# Patient Record
Sex: Female | Born: 1980 | Race: White | Hispanic: No | Marital: Single | Smoking: Never smoker
Health system: Southern US, Community
[De-identification: ages and names within clinical notes are randomized; demographics above are authoritative.]

## PROBLEM LIST (undated history)

## (undated) DIAGNOSIS — R87629 Unspecified abnormal cytological findings in specimens from vagina: Secondary | ICD-10-CM

## (undated) HISTORY — DX: Unspecified abnormal cytological findings in specimens from vagina: R87.629

---

## 2006-03-12 ENCOUNTER — Ambulatory Visit (HOSPITAL_COMMUNITY): Admission: RE | Admit: 2006-03-12 | Discharge: 2006-03-12 | Payer: Self-pay | Admitting: Obstetrics & Gynecology

## 2006-05-12 DIAGNOSIS — O1490 Unspecified pre-eclampsia, unspecified trimester: Secondary | ICD-10-CM

## 2006-06-05 ENCOUNTER — Inpatient Hospital Stay (HOSPITAL_COMMUNITY): Admission: AD | Admit: 2006-06-05 | Discharge: 2006-06-07 | Payer: Self-pay | Admitting: Obstetrics and Gynecology

## 2006-12-17 ENCOUNTER — Emergency Department: Payer: Self-pay | Admitting: Emergency Medicine

## 2007-10-26 ENCOUNTER — Inpatient Hospital Stay (HOSPITAL_COMMUNITY): Admission: RE | Admit: 2007-10-26 | Discharge: 2007-10-28 | Payer: Self-pay | Admitting: Obstetrics & Gynecology

## 2008-09-09 IMAGING — CR RIGHT ANKLE - COMPLETE 3+ VIEW
1 series · 4 of 4 positions shown · non-contrast
Comparison: none

REASON FOR EXAM: FALL
COMMENTS:

PROCEDURE:     DXR - DXR ANKLE RIGHT COMPLETE  - December 17, 2006 [DATE]
RESULT:     No fracture, dislocation or other acute bony abnormality is
identified. The ankle mortise is well maintained.

[Series 1: view not recorded · 0.17mm/px · 4 of 4 slices shown]
[im 1/4]
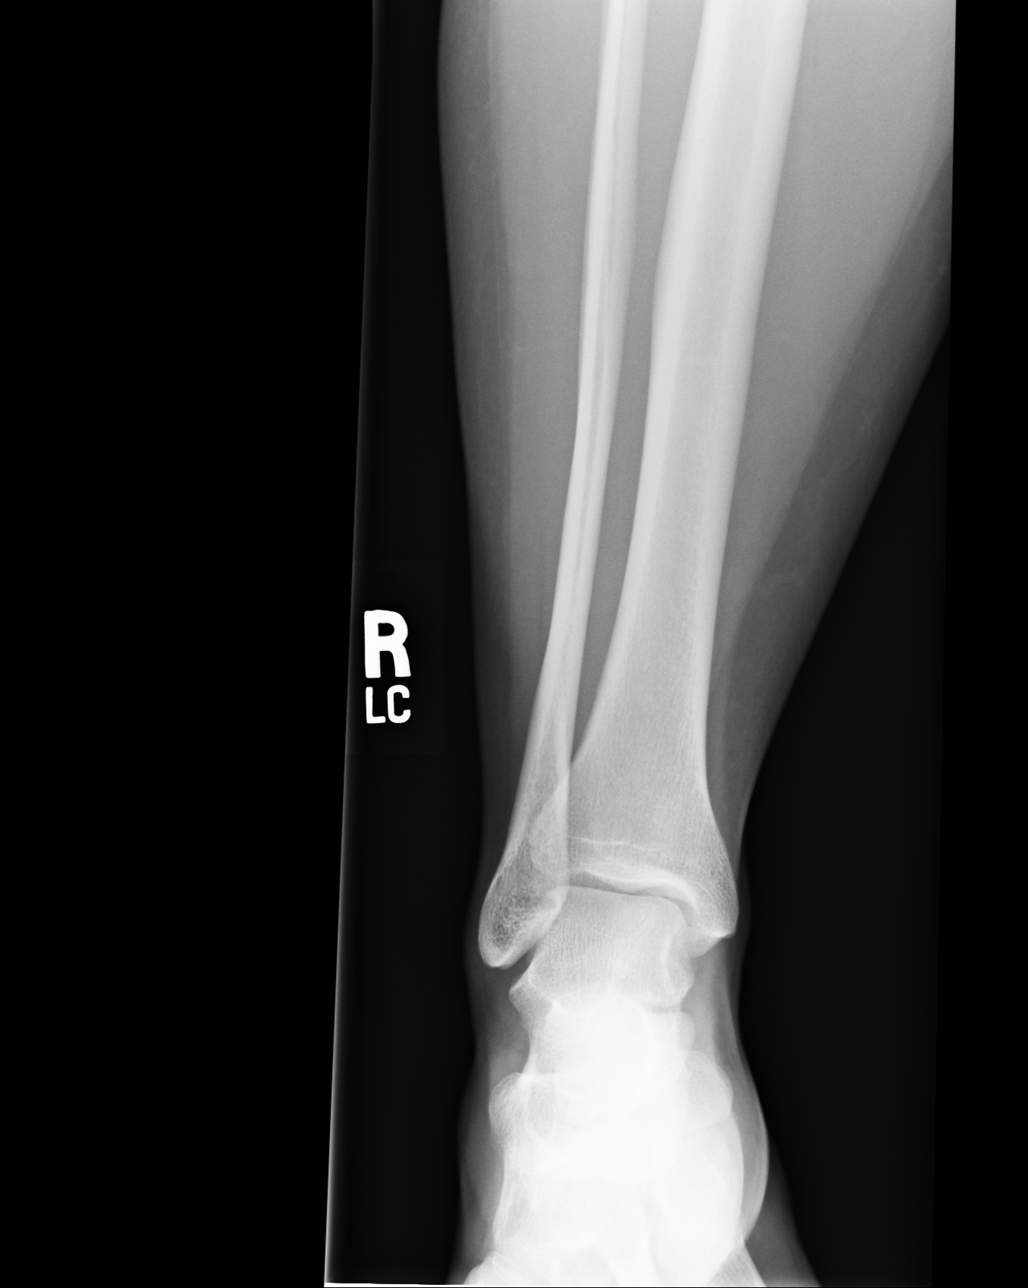
[im 2/4]
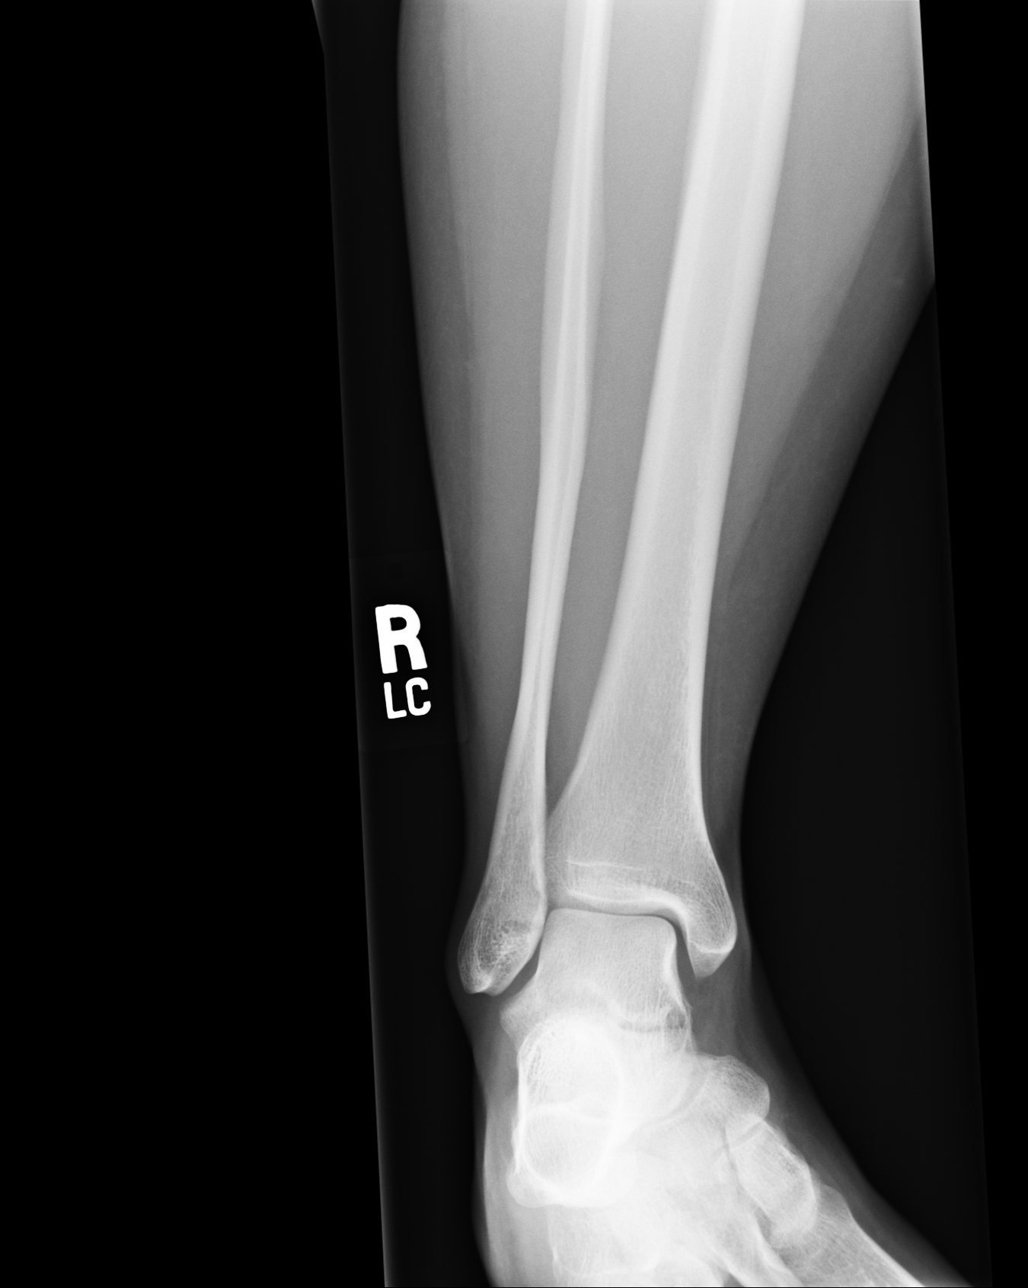
[im 3/4]
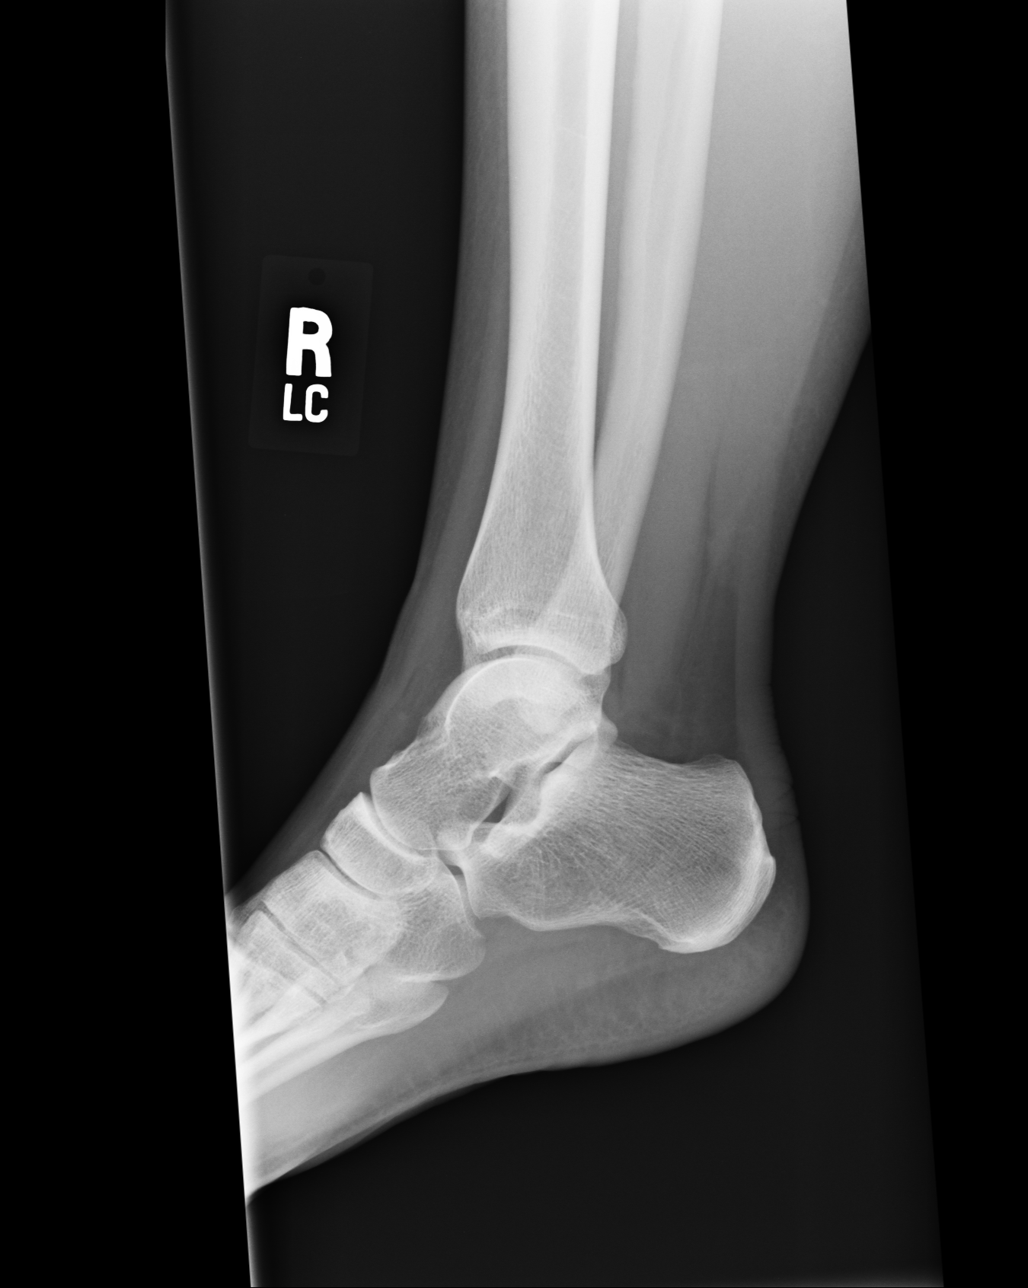
[im 4/4]
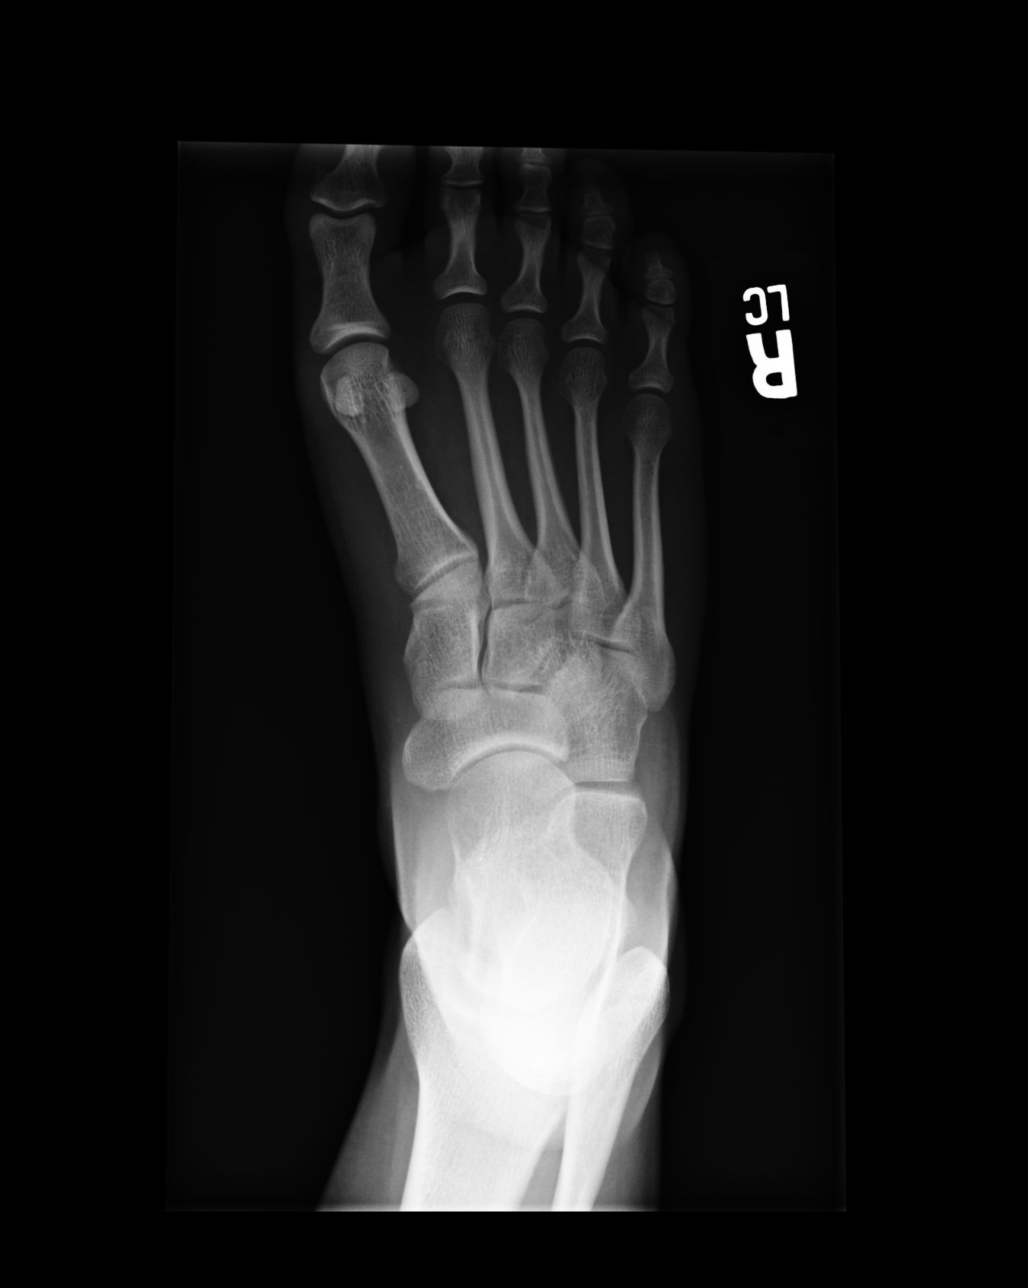

[4 of 4 positions shown; findings below may reference images not displayed]

IMPRESSION: 1.     No significant osseous abnormalities are noted.

## 2010-09-27 NOTE — H&P (Signed)
NAME:  Brenda Leonard, MACIVER            ACCOUNT NO.:  [id number]   MEDICAL RECORD NO.:  [id number]          PATIENT TYPE:  INP   LOCATION:                                FACILITY:  [facility]   PHYSICIAN:  Guy Sandifer. Henderson Cloud, M.D. DATE OF BIRTH:  1980-09-22   DATE OF ADMISSION:  06/05/2006  DATE OF DISCHARGE:  06/07/2006                              HISTORY & PHYSICAL   CHIEF COMPLAINT:  Low amniotic fluid.   HISTORY OF PRESENT ILLNESS:  This patient is a 30 year old G1, P0 with  an EDC of June 17, 2006, placing her at 38-2/7 weeks.  Evaluation in  the office today reveals vertex baby with an estimated fetal weight of 6  pounds 10 ounces at the 38th percentile.  Amniotic fluid index is 7.5  which is the 5th percentile.  Biophysical profile is 8/8 and nonstress  test was reactive.  Also noted in the office was a blood pressure of  118/74 and 1+ urine protein.  After discussion with patient, she is  being admitted to the hospital for induction of labor with low amniotic  fluid.   MEDICATIONS:  Prenatal vitamins.   ALLERGIES:  NO KNOWN DRUG ALLERGIES.   PAST MEDICAL HISTORY:  See prenatal history and physical.   PAST SURGICAL HISTORY:  See prenatal history and physical.   OBSTETRICAL HISTORY:  See prenatal history and physical.   SOCIAL HISTORY:  Denies tobacco, alcohol or drug abuse.   REVIEW OF SYSTEMS:  Denies headache, blurry vision, epigastric pain.   PHYSICAL EXAMINATION:  VITAL SIGNS:  Height 5 feet 3 inches, weight 189  pounds, blood pressure 118/74.  LUNGS:  Clear to auscultation.  CARDIOVASCULAR:  Regular rate and rhythm without rubs.  ABDOMEN:  Gravid with no epigastric tenderness.  PELVIC:  Cervix is 3 cm dilated, 80% effaced, -2 station and vertex.  EXTREMITIES:  There is 1+ edema.  Deep tendon reflexes 2+ without  clonus.   LABORATORY DATA:  Group B Beta strep culture is negative.   ASSESSMENT:  Intrauterine pregnancy at 38-1/2 weeks with low amniotic  fluid.   PLAN:  Would admit for two stage induction of labor.  Will check [facility]  labs and repeat clean catch urine.      Guy Sandifer Henderson Cloud, M.D.  Electronically Signed     JET/MEDQ  D:  06/05/2006  T:  06/05/2006  Job:  [id number]

## 2011-02-06 LAB — CBC
Hemoglobin: 10.6 — ABNORMAL LOW
MCHC: 33.4
MCV: 82.6
Platelets: 146 — ABNORMAL LOW
Platelets: 201
RDW: 15.2
WBC: 9.4

## 2011-02-06 LAB — RH IMMUNE GLOB WKUP(>/=20WKS)(NOT WOMEN'S HOSP): Fetal Screen: NEGATIVE

## 2011-05-13 DIAGNOSIS — R87629 Unspecified abnormal cytological findings in specimens from vagina: Secondary | ICD-10-CM

## 2011-05-13 HISTORY — DX: Unspecified abnormal cytological findings in specimens from vagina: R87.629

## 2011-12-29 ENCOUNTER — Ambulatory Visit: Payer: Self-pay | Admitting: Internal Medicine

## 2011-12-29 DIAGNOSIS — Z0289 Encounter for other administrative examinations: Secondary | ICD-10-CM

## 2013-07-06 ENCOUNTER — Emergency Department: Payer: Self-pay | Admitting: Emergency Medicine

## 2013-07-06 LAB — URINALYSIS, COMPLETE
BLOOD: NEGATIVE
Bilirubin,UR: NEGATIVE
GLUCOSE, UR: NEGATIVE mg/dL (ref 0–75)
Ketone: NEGATIVE
Leukocyte Esterase: NEGATIVE
NITRITE: NEGATIVE
PH: 5 (ref 4.5–8.0)
PROTEIN: NEGATIVE
SPECIFIC GRAVITY: 1.011 (ref 1.003–1.030)
Squamous Epithelial: <1

## 2013-07-06 LAB — CBC WITH DIFFERENTIAL/PLATELET
BASOS PCT: 0.9 %
Basophil #: 0.1 10*3/uL (ref 0.0–0.1)
EOS ABS: 0.1 10*3/uL (ref 0.0–0.7)
Eosinophil %: 1.3 %
HCT: 40.3 % (ref 35.0–47.0)
HGB: 13.8 g/dL (ref 12.0–16.0)
LYMPHS PCT: 32.5 %
Lymphocyte #: 2.1 10*3/uL (ref 1.0–3.6)
MCH: 30.6 pg (ref 26.0–34.0)
MCHC: 34.1 g/dL (ref 32.0–36.0)
MCV: 90 fL (ref 80–100)
MONO ABS: 0.5 x10 3/mm (ref 0.2–0.9)
Monocyte %: 8.2 %
NEUTROS ABS: 3.6 10*3/uL (ref 1.4–6.5)
Neutrophil %: 57.1 %
Platelet: 170 10*3/uL (ref 150–440)
RBC: 4.49 10*6/uL (ref 3.80–5.20)
RDW: 13.3 % (ref 11.5–14.5)
WBC: 6.3 10*3/uL (ref 3.6–11.0)

## 2013-07-06 LAB — COMPREHENSIVE METABOLIC PANEL
ALBUMIN: 3.6 g/dL (ref 3.4–5.0)
ALK PHOS: 85 U/L
ALT: 14 U/L (ref 12–78)
ANION GAP: 7 (ref 7–16)
AST: 11 U/L — AB (ref 15–37)
BUN: 13 mg/dL (ref 7–18)
Bilirubin,Total: 0.3 mg/dL (ref 0.2–1.0)
CALCIUM: 8.7 mg/dL (ref 8.5–10.1)
Chloride: 107 mmol/L (ref 98–107)
Co2: 24 mmol/L (ref 21–32)
Creatinine: 0.99 mg/dL (ref 0.60–1.30)
Glucose: 92 mg/dL (ref 65–99)
Osmolality: 275 (ref 275–301)
Potassium: 3.9 mmol/L (ref 3.5–5.1)
SODIUM: 138 mmol/L (ref 136–145)
TOTAL PROTEIN: 7.8 g/dL (ref 6.4–8.2)

## 2013-07-06 LAB — TROPONIN I: Troponin-I: <0.02 ng/mL

## 2013-10-06 ENCOUNTER — Emergency Department: Payer: Self-pay | Admitting: Emergency Medicine

## 2013-10-06 LAB — BASIC METABOLIC PANEL
ANION GAP: 6 — AB (ref 7–16)
BUN: 16 mg/dL (ref 7–18)
CALCIUM: 9.2 mg/dL (ref 8.5–10.1)
CHLORIDE: 105 mmol/L (ref 98–107)
CREATININE: 0.97 mg/dL (ref 0.60–1.30)
Co2: 27 mmol/L (ref 21–32)
GLUCOSE: 92 mg/dL (ref 65–99)
Osmolality: 277 (ref 275–301)
POTASSIUM: 4.3 mmol/L (ref 3.5–5.1)
SODIUM: 138 mmol/L (ref 136–145)

## 2013-10-06 LAB — TROPONIN I: Troponin-I: <0.02 ng/mL

## 2013-10-06 LAB — CBC
HCT: 40.2 % (ref 35.0–47.0)
HGB: 13.6 g/dL (ref 12.0–16.0)
MCH: 31.3 pg (ref 26.0–34.0)
MCHC: 33.7 g/dL (ref 32.0–36.0)
MCV: 93 fL (ref 80–100)
Platelet: 237 10*3/uL (ref 150–440)
RBC: 4.34 10*6/uL (ref 3.80–5.20)
RDW: 13.1 % (ref 11.5–14.5)
WBC: 10.7 10*3/uL (ref 3.6–11.0)

## 2013-10-06 LAB — HEPATIC FUNCTION PANEL A (ARMC)
ALBUMIN: 3.8 g/dL (ref 3.4–5.0)
ALK PHOS: 104 U/L
BILIRUBIN DIRECT: 0.1 mg/dL (ref 0.00–0.20)
Bilirubin,Total: 0.3 mg/dL (ref 0.2–1.0)
SGOT(AST): 45 U/L — ABNORMAL HIGH (ref 15–37)
SGPT (ALT): 26 U/L (ref 12–78)
TOTAL PROTEIN: 8 g/dL (ref 6.4–8.2)

## 2013-10-06 LAB — LIPASE, BLOOD: LIPASE: 201 U/L (ref 73–393)

## 2013-11-09 HISTORY — PX: GALLBLADDER SURGERY: SHX652

## 2013-11-22 DIAGNOSIS — K8050 Calculus of bile duct without cholangitis or cholecystitis without obstruction: Secondary | ICD-10-CM | POA: Insufficient documentation

## 2014-02-13 DIAGNOSIS — F329 Major depressive disorder, single episode, unspecified: Secondary | ICD-10-CM | POA: Insufficient documentation

## 2014-02-13 DIAGNOSIS — F32A Depression, unspecified: Secondary | ICD-10-CM | POA: Insufficient documentation

## 2014-02-13 DIAGNOSIS — F419 Anxiety disorder, unspecified: Secondary | ICD-10-CM

## 2014-02-13 DIAGNOSIS — E669 Obesity, unspecified: Secondary | ICD-10-CM | POA: Insufficient documentation

## 2014-09-29 LAB — OB RESULTS CONSOLE RPR: RPR: NONREACTIVE

## 2014-09-29 LAB — OB RESULTS CONSOLE TSH: TSH: 1.38

## 2014-09-29 LAB — OB RESULTS CONSOLE ABO/RH: RH Type: NEGATIVE

## 2014-09-29 LAB — OB RESULTS CONSOLE PLATELET COUNT: Platelets: 216 10*3/uL

## 2014-09-29 LAB — OB RESULTS CONSOLE VARICELLA ZOSTER ANTIBODY, IGG: Varicella: NON-IMMUNE/NOT IMMUNE

## 2014-09-29 LAB — OB RESULTS CONSOLE HGB/HCT, BLOOD
HEMATOCRIT: 38 %
HEMOGLOBIN: 12.9 g/dL

## 2014-09-29 LAB — OB RESULTS CONSOLE HEPATITIS B SURFACE ANTIGEN: Hepatitis B Surface Ag: NEGATIVE

## 2014-09-29 LAB — OB RESULTS CONSOLE ANTIBODY SCREEN: Antibody Screen: NEGATIVE

## 2014-09-29 LAB — HEMOGLOBIN A1C: HEMOGLOBIN A1C: 5.8 % (ref 4.0–6.0)

## 2014-09-29 LAB — OB RESULTS CONSOLE RUBELLA ANTIBODY, IGM: RUBELLA: NON-IMMUNE/NOT IMMUNE

## 2014-10-11 ENCOUNTER — Telehealth: Payer: Self-pay | Admitting: Obstetrics and Gynecology

## 2014-10-11 DIAGNOSIS — O219 Vomiting of pregnancy, unspecified: Secondary | ICD-10-CM

## 2014-10-11 NOTE — Telephone Encounter (Signed)
Patient called. She stated she is very nauseous and would like a script for diclegis. [facility] [address]Thanks

## 2014-10-12 MED ORDER — DOXYLAMINE-PYRIDOXINE 10-10 MG PO TBEC: 10.0000 mg | DELAYED_RELEASE_TABLET | Freq: Four times a day (QID) | ORAL | Status: DC | PRN

## 2014-10-12 NOTE — Telephone Encounter (Signed)
RX sent in for pt for Diclegis per protocol.

## 2014-10-12 NOTE — Addendum Note (Signed)
Addended by: Frankey ShownGLANTON, Zebulon Gantt C on: 10/12/2014 03:53 PM   Modules accepted: Orders

## 2014-10-26 ENCOUNTER — Encounter: Payer: Self-pay | Admitting: Obstetrics and Gynecology

## 2014-10-26 ENCOUNTER — Ambulatory Visit (INDEPENDENT_AMBULATORY_CARE_PROVIDER_SITE_OTHER): Payer: BLUE CROSS/BLUE SHIELD | Admitting: Obstetrics and Gynecology

## 2014-10-26 VITALS — BP 130/82 | HR 73 | Ht 62.0 in | Wt 198.1 lb

## 2014-10-26 DIAGNOSIS — R87619 Unspecified abnormal cytological findings in specimens from cervix uteri: Secondary | ICD-10-CM

## 2014-10-26 DIAGNOSIS — Z36 Encounter for antenatal screening of mother: Secondary | ICD-10-CM

## 2014-10-26 DIAGNOSIS — Z87440 Personal history of urinary (tract) infections: Secondary | ICD-10-CM | POA: Insufficient documentation

## 2014-10-26 DIAGNOSIS — Z369 Encounter for antenatal screening, unspecified: Secondary | ICD-10-CM

## 2014-10-26 DIAGNOSIS — F411 Generalized anxiety disorder: Secondary | ICD-10-CM

## 2014-10-26 DIAGNOSIS — Z3401 Encounter for supervision of normal first pregnancy, first trimester: Secondary | ICD-10-CM

## 2014-10-26 DIAGNOSIS — F419 Anxiety disorder, unspecified: Secondary | ICD-10-CM | POA: Insufficient documentation

## 2014-10-26 MED ORDER — ASPIRIN EC 81 MG PO TBEC: 81.0000 mg | DELAYED_RELEASE_TABLET | Freq: Every day | ORAL | Status: DC

## 2014-10-26 NOTE — Progress Notes (Signed)
Subjective:    Brenda Leonard is being seen today for her first obstetrical visit.  This is not a planned pregnancy. Patient was taking OCPs for contraception at discovery of pregnancy (denies missing a dose). She is at [redacted]w[redacted]d gestation, Estimated Date of Delivery: 05/16/15 by 7 week sono, Patient's last menstrual period was 08/09/2014 (approximate).  Her obstetrical history is significant for obesity and pre-eclampsia (in a previous pregnancy). Relationship with FOB: spouse, living together. Patient does intend to breast feed.  Desires partner vasectomy for contraceptive method. Pregnancy history fully reviewed.  Menstrual History: Obstetric History   G3   P2   T2   P0   A0   TAB0   SAB0   E0   M0   L2     # Outcome Date GA Lbr Len/2nd Weight Sex Delivery Anes PTL Lv  3 Current           2 Term 2009 [redacted]w[redacted]d  6 lb 10 oz (3.005 kg) F Vag-Spont  N Y  1 Term 2008 [redacted]w[redacted]d  5 lb 6 oz (2.438 kg) F Vag-Spont  N Y     Complications: Preeclampsia      Menarche age: 34 Patient's last menstrual period was 08/09/2014 (approximate). Period Duration (Days): 4-5 Period Pattern: Regular Menstrual Flow: Moderate Dysmenorrhea: None  H/o abnormal pap smear in 2013, followed by colposcopy).  Subsequent paps normal.  Denies h/o STIs.    Past Medical History  Diagnosis Date  . Vaginal Pap smear, abnormal 2013   Family History  Problem Relation Age of Onset  . Heart disease Sister     Heart Deformation   Past Surgical History  Procedure Laterality Date  . Gallbladder surgery  11/2013   History   Social History  . Marital Status: Married    Spouse Name: N/A  . Number of Children: N/A  . Years of Education: N/A   Occupational History  . Not on file.   Social History Main Topics  . Smoking status: Never Smoker   . Smokeless tobacco: Never Used  . Alcohol Use: No  . Drug Use: Not on file  . Sexual Activity: Yes    Birth Control/ Protection: None     Comment: Pregnant   Other Topics  Concern  . Not on file   Social History Narrative   Medications:  PNV Previously taking Diclegis for nausea/vomiting of pregnancy, however has discontinued as symptoms have resolved.   Allergies  Allergen Reactions  . Iodinated Diagnostic Agents       Review of Systems General:Not Present- Fever, Weight Loss and Weight Gain. Skin:Not Present- Rash. HEENT:Not Present- Blurred Vision, Headache and Bleeding Gums. Respiratory:Not Present- Difficulty Breathing. Breast:Not Present- Breast Mass. Cardiovascular:Not Present- Chest Pain, Elevated Blood Pressure, Fainting / Blacking Out and Shortness of Breath. Gastrointestinal:Present- Indigestion. Not Present- Abdominal Pain, Constipation, Nausea and Vomiting. Female Genitourinary:Not Present- Frequency, Painful Urination, Pelvic Pain, Vaginal Bleeding, Vaginal Discharge, Contractions, regular, Fetal Movements Decreased, Urinary Complaints and Vaginal Fluid. Musculoskeletal:Not Present- Back Pain and Leg Cramps. Neurological:Not Present- Dizziness. Psychiatric:Not Present- Depression.    Objective:  Physical Exam:  Blood pressure 130/82, pulse 73, height  (1.575 m), weight 198 lb 1.6 oz (89.858 kg), last menstrual period 08/09/2014.   Constitutional: She is oriented to person, place, and time and well-developed, well-nourished, and in no distress.  HENT:  Head: Normocephalic and atraumatic.  Neck: Normal range of motion. Neck supple. No thyromegaly present.  Cardiovascular: Normal rate, regular rhythm and normal heart sounds.  Pulmonary/Chest: Effort normal and breath sounds normal.  Abdominal: Soft. There is no tenderness.       FHT: 164     FH: 12 cm  Genitourinary: External genitalia normal. Vagina normal, no vaginal discharge found. Cervix normal appearing, no CMT. Uterus enlarged, ~ 12 week sized, mobile. Adnexae nontender, nonpalpable. Rectovaginal septum normal.  Musculoskeletal: Normal range of motion.   Neurological: She is alert and oriented to person, place, and time.  Skin: Skin is warm and dry. No rash noted.  Psychiatric: Affect normal.    Assessment:    Pregnancy at 11 and 1/7 weeks   Obesity in pregnancy History of pre-eclampsia in prior pregnancy Rh negative status H/o abnormal pap smear (2013).    Plan:   Prenatal labs reviewed.  Pap, GC/Cl done today.  Continue prenatal vitamins. Begin daily baby aspirin, 81 mg, for risk factors of pre-eclampsia in current pregnancy . Recommend early glucola for risk factors of obesity and slightly elevated HgbA1c (5.8%).  Problem list reviewed and updated. New OB counseling: The patient has been given an overview regarding routine prenatal care. Recommendations regarding diet, weight gain, and exercise in pregnancy were given. Prenatal testing, optional genetic testing, and ultrasound use in pregnancy were reviewed. Patient desires Panorama testing.  Will perform today.  Benefits of Breast Feeding were discussed. The patient is planning on nursing her baby post partum. Rhogam at 28 weeks.  Given bleeding precautions.  Patient plans to travel on cruise in next several weeks to [place].  Has questions regarding Zika virus.  All questions answered.  Given handouts from [facility] regarding recommendations. Advised on insect repellant, protective clothing, and avoiding areas (i.e. Excursions involving nature/rainforest). Encouraged to f/u immediately if any symptoms occur.  Follow up in 4 weeks.   Hildred Laser, MD [facility]

## 2014-10-26 NOTE — Progress Notes (Signed)
ZIKA EXPOSURE SCREEN:  The patient has not traveled to a Zika Virus endemic area within the past 6 months, nor has she had unprotected sex with a partner who has travelled to a Zika endemic region within the past 6 months. The patient has been advised to notify us if these factors change any time during this current pregnancy, so adequate testing and monitoring can be initiated.  

## 2014-10-26 NOTE — Patient Instructions (Addendum)
Return to clinic in 4 weeks.  You will ned to perform early glucola at that time.  Begin taking a daily aspirin 81 mg.

## 2014-10-26 NOTE — Progress Notes (Signed)
Pt unable to void for UA

## 2014-10-27 LAB — PAP IG, CT-NG, RFX HPV ASCU
Chlamydia, Nuc. Acid Amp: NEGATIVE
GONOCOCCUS BY NUCLEIC ACID AMP: NEGATIVE
PAP Smear Comment: 0

## 2014-10-29 LAB — NUSWAB VAGINITIS PLUS (VG+): CANDIDA GLABRATA, NAA: NEGATIVE

## 2014-10-30 ENCOUNTER — Telehealth: Payer: Self-pay | Admitting: Obstetrics and Gynecology

## 2014-10-30 NOTE — Telephone Encounter (Signed)
Yes.  I informed patient that she may have some spotting after having speculum exam last week.  As long as bleeding is not heavy and no cramping has occurred, she does not need any additional f/u at this time.

## 2014-10-30 NOTE — Telephone Encounter (Signed)
PT IS [redacted] WKS PREGNANT HAD  SPOTTING AFTER EXAM LAST Thursday ON FRIDAY. EVERYTHING IS OK NO MORE SPOTTING. SHE TALKIED TO AFTER HRS NURSE AND WAS INFORMED TO LET YOU KNOW WHAT HAPPENED IN CASE SHE NEEDED A F/U

## 2014-11-01 ENCOUNTER — Telehealth: Payer: Self-pay

## 2014-11-01 NOTE — Telephone Encounter (Signed)
-----   Message from Hildred Laser, MD sent at 10/26/2014 10:40 AM EDT ----- Regarding: medication PNV was not listed on patient's medication list and I forgot to verify with her if she was taking it.  Please encourage patient to take if not already taking.  Also, she needs to begin a daily baby aspirin 81 mg due to risk factors for pre-eclampsia (h/o pre-eclampsia in prior pregnancy, slightly increased HgbA1c , increased BMI).   Thanks.   Dr. Valentino Saxon

## 2014-11-01 NOTE — Telephone Encounter (Signed)
Called pt she states that she is taking prenatal vitamin, and will begin baby aspirin regimen today.

## 2014-11-02 ENCOUNTER — Telehealth: Payer: Self-pay | Admitting: Obstetrics and Gynecology

## 2014-11-02 NOTE — Telephone Encounter (Signed)
CALLING FOR RESULTS OF PANORAMA. SHE SAID IF THEY ARE BACK IT'S FINE TO PRINT AND PUT IN AN ENVELOPE AND BRING UP FRONT.

## 2014-11-03 NOTE — Telephone Encounter (Signed)
Called [facility] to inquire about this pt's test results, they state that they just received the patient's test sample yesterday and require a 5-7 business day turn around for results. Informed pt of this information and that I would inform her as soon as I got the results back.

## 2014-11-07 ENCOUNTER — Encounter: Payer: Self-pay | Admitting: Obstetrics and Gynecology

## 2014-11-23 ENCOUNTER — Other Ambulatory Visit: Payer: BLUE CROSS/BLUE SHIELD

## 2014-11-23 ENCOUNTER — Ambulatory Visit (INDEPENDENT_AMBULATORY_CARE_PROVIDER_SITE_OTHER): Payer: BLUE CROSS/BLUE SHIELD | Admitting: Obstetrics and Gynecology

## 2014-11-23 ENCOUNTER — Encounter: Payer: Self-pay | Admitting: Obstetrics and Gynecology

## 2014-11-23 VITALS — BP 103/68 | HR 75 | Wt 196.5 lb

## 2014-11-23 DIAGNOSIS — Z131 Encounter for screening for diabetes mellitus: Secondary | ICD-10-CM

## 2014-11-23 DIAGNOSIS — Z3402 Encounter for supervision of normal first pregnancy, second trimester: Secondary | ICD-10-CM

## 2014-11-23 DIAGNOSIS — R399 Unspecified symptoms and signs involving the genitourinary system: Secondary | ICD-10-CM

## 2014-11-23 DIAGNOSIS — E669 Obesity, unspecified: Secondary | ICD-10-CM

## 2014-11-23 LAB — POCT URINALYSIS DIPSTICK
BILIRUBIN UA: NEGATIVE
Glucose, UA: NEGATIVE
KETONES UA: NEGATIVE
Leukocytes, UA: NEGATIVE
NITRITE UA: POSITIVE
PH UA: 6
Protein, UA: NEGATIVE
Spec Grav, UA: 1.025
UROBILINOGEN UA: 0.2

## 2014-11-23 NOTE — Progress Notes (Signed)
ROB: Patient doing well. Denies complaints. [place]  Denies symptoms of viral illness.  Continue to monitor.  Is performing early glucola today. RTC in 4 weeks.  For anatomy scan at that time.

## 2014-11-24 ENCOUNTER — Telehealth: Payer: Self-pay

## 2014-11-24 LAB — HEMOGLOBIN AND HEMATOCRIT, BLOOD
Hematocrit: 35.5 % (ref 34.0–46.6)
Hemoglobin: 11.4 g/dL (ref 11.1–15.9)

## 2014-11-24 LAB — GLUCOSE TOLERANCE, 1 HOUR: GLUCOSE, 1HR PP: 163 mg/dL (ref 65–199)

## 2014-11-24 NOTE — Telephone Encounter (Signed)
-----   Message from Hildred LaserAnika Cherry, MD sent at 11/24/2014 12:59 PM EDT ----- Not sure what happened but this isn't the correct test.  The normal range parameters are different.  But based on her glucose level, she is above the parameters of 140 (for the 1 hr glucola), which means she needs to take the 3 hr test.

## 2014-11-24 NOTE — Telephone Encounter (Signed)
Informed pt of the need to come in for 3hour testing. Pt gave verbal understanding.

## 2014-11-25 LAB — URINE CULTURE

## 2014-11-27 ENCOUNTER — Telehealth: Payer: Self-pay

## 2014-11-27 DIAGNOSIS — B9620 Unspecified Escherichia coli [E. coli] as the cause of diseases classified elsewhere: Secondary | ICD-10-CM

## 2014-11-27 DIAGNOSIS — N390 Urinary tract infection, site not specified: Principal | ICD-10-CM

## 2014-11-27 MED ORDER — NITROFURANTOIN MONOHYD MACRO 100 MG PO CAPS: 100.0000 mg | ORAL_CAPSULE | Freq: Two times a day (BID) | ORAL | Status: DC

## 2014-11-27 NOTE — Telephone Encounter (Signed)
Called pt LM informing her of UTI, RX sent in.

## 2014-11-27 NOTE — Telephone Encounter (Signed)
-----   Message from Hildred LaserAnika Cherry, MD sent at 11/26/2014  4:19 PM EDT ----- Needs prescription for Macrobid for E. Coli UTI.

## 2014-11-29 ENCOUNTER — Other Ambulatory Visit: Payer: BLUE CROSS/BLUE SHIELD

## 2014-11-29 ENCOUNTER — Other Ambulatory Visit: Payer: Self-pay

## 2014-11-29 ENCOUNTER — Other Ambulatory Visit: Payer: Self-pay | Admitting: Obstetrics and Gynecology

## 2014-11-29 DIAGNOSIS — Z3402 Encounter for supervision of normal first pregnancy, second trimester: Secondary | ICD-10-CM

## 2014-11-29 DIAGNOSIS — O99810 Abnormal glucose complicating pregnancy: Secondary | ICD-10-CM

## 2014-11-29 DIAGNOSIS — R7309 Other abnormal glucose: Secondary | ICD-10-CM

## 2014-12-05 ENCOUNTER — Other Ambulatory Visit: Payer: Self-pay | Admitting: Obstetrics and Gynecology

## 2014-12-06 LAB — GESTATIONAL GLUCOSE TOLERANCE
GLUCOSE 2 HOUR GTT: 164 mg/dL — AB (ref 65–154)
GLUCOSE FASTING: 86 mg/dL (ref 65–94)
Glucose, GTT - 1 Hour: 160 mg/dL (ref 65–179)
Glucose, GTT - 3 Hour: 139 mg/dL (ref 65–139)

## 2014-12-12 ENCOUNTER — Telehealth: Payer: Self-pay

## 2014-12-12 NOTE — Telephone Encounter (Signed)
-----   Message from Hildred Laser, MD sent at 12/07/2014  9:22 AM EDT ----- 3 hr GTT negative.  Can inform.

## 2014-12-12 NOTE — Telephone Encounter (Signed)
Pt informed of negative Glucola.

## 2014-12-15 ENCOUNTER — Telehealth: Payer: Self-pay | Admitting: Obstetrics and Gynecology

## 2014-12-19 ENCOUNTER — Other Ambulatory Visit: Payer: Self-pay

## 2014-12-19 ENCOUNTER — Ambulatory Visit (INDEPENDENT_AMBULATORY_CARE_PROVIDER_SITE_OTHER): Payer: BLUE CROSS/BLUE SHIELD | Admitting: Obstetrics and Gynecology

## 2014-12-19 ENCOUNTER — Ambulatory Visit: Payer: BLUE CROSS/BLUE SHIELD

## 2014-12-19 ENCOUNTER — Encounter: Payer: Self-pay | Admitting: Obstetrics and Gynecology

## 2014-12-19 VITALS — BP 99/64 | HR 60 | Wt 196.5 lb

## 2014-12-19 DIAGNOSIS — B9620 Unspecified Escherichia coli [E. coli] as the cause of diseases classified elsewhere: Secondary | ICD-10-CM

## 2014-12-19 DIAGNOSIS — N390 Urinary tract infection, site not specified: Secondary | ICD-10-CM

## 2014-12-19 DIAGNOSIS — Z3492 Encounter for supervision of normal pregnancy, unspecified, second trimester: Secondary | ICD-10-CM

## 2014-12-19 LAB — POCT URINALYSIS DIPSTICK
BILIRUBIN UA: NEGATIVE
Blood, UA: NEGATIVE
Glucose, UA: NEGATIVE
KETONES UA: NEGATIVE
Nitrite, UA: POSITIVE
Protein, UA: NEGATIVE
Spec Grav, UA: 1.01
UROBILINOGEN UA: 0.2
pH, UA: 7.5

## 2014-12-19 MED ORDER — NITROFURANTOIN MONOHYD MACRO 100 MG PO CAPS: 100.0000 mg | ORAL_CAPSULE | Freq: Two times a day (BID) | ORAL | Status: DC

## 2014-12-19 NOTE — Progress Notes (Signed)
ROB: Patient doing well, no complaints. S/p normal anatomy scan, female gender.  Early glucola elevated with 3 hr GTT normal (1 value elevated). Will need to repeat 3 hr testing at 28 week visit. Discussed poor weight gain in pregnancy so far.  Patient notes only modest appetite, and tries to avoid foods that cause heartburn.  Advised on Boost/Ensure or protein bars for nutritional supplementation.  Patient did not get prescription for Macrobid for positive nitrites in urine. Will resend for patient to pick up today. RTC in 4 weeks.

## 2014-12-19 NOTE — Progress Notes (Signed)
No complaints- Pt did not get Macrobid for uti on 7/14- Pos nitrites today. Will resend in macrobid.

## 2014-12-21 LAB — URINE CULTURE

## 2015-01-16 ENCOUNTER — Ambulatory Visit (INDEPENDENT_AMBULATORY_CARE_PROVIDER_SITE_OTHER): Payer: BLUE CROSS/BLUE SHIELD | Admitting: Obstetrics and Gynecology

## 2015-01-16 VITALS — BP 112/71 | HR 80 | Wt 198.7 lb

## 2015-01-16 DIAGNOSIS — Z3492 Encounter for supervision of normal pregnancy, unspecified, second trimester: Secondary | ICD-10-CM

## 2015-01-16 DIAGNOSIS — Z3482 Encounter for supervision of other normal pregnancy, second trimester: Secondary | ICD-10-CM

## 2015-01-16 LAB — POCT URINALYSIS DIPSTICK
Bilirubin, UA: NEGATIVE
GLUCOSE UA: NEGATIVE
KETONES UA: 40
Leukocytes, UA: NEGATIVE
NITRITE UA: NEGATIVE
PH UA: 6
Protein, UA: NEGATIVE
RBC UA: NEGATIVE
SPEC GRAV UA: 1.02
Urobilinogen, UA: NEGATIVE

## 2015-01-16 NOTE — Progress Notes (Signed)
ROB: Patient doing well, no complaints. Needs repeat 3 hr glucola next visit and rhogam.  Is planning to have flu vaccine administered through job.  Patient notes that she had forgotten to take antibiotics for UTI, but is actively taking now. For urine culture next visit. RTC in 4 weeks.

## 2015-02-14 ENCOUNTER — Ambulatory Visit (INDEPENDENT_AMBULATORY_CARE_PROVIDER_SITE_OTHER): Payer: BLUE CROSS/BLUE SHIELD | Admitting: Obstetrics and Gynecology

## 2015-02-14 VITALS — BP 116/74 | HR 78 | Wt 199.8 lb

## 2015-02-14 DIAGNOSIS — Z87440 Personal history of urinary (tract) infections: Secondary | ICD-10-CM

## 2015-02-14 DIAGNOSIS — Z3482 Encounter for supervision of other normal pregnancy, second trimester: Secondary | ICD-10-CM

## 2015-02-14 DIAGNOSIS — Z3492 Encounter for supervision of normal pregnancy, unspecified, second trimester: Secondary | ICD-10-CM

## 2015-02-14 DIAGNOSIS — Z131 Encounter for screening for diabetes mellitus: Secondary | ICD-10-CM

## 2015-02-14 DIAGNOSIS — Z23 Encounter for immunization: Secondary | ICD-10-CM

## 2015-02-14 DIAGNOSIS — Z2913 Encounter for prophylactic Rho(D) immune globulin: Secondary | ICD-10-CM | POA: Insufficient documentation

## 2015-02-14 DIAGNOSIS — R7302 Impaired glucose tolerance (oral): Secondary | ICD-10-CM

## 2015-02-14 DIAGNOSIS — O99210 Obesity complicating pregnancy, unspecified trimester: Secondary | ICD-10-CM

## 2015-02-14 DIAGNOSIS — Z418 Encounter for other procedures for purposes other than remedying health state: Secondary | ICD-10-CM

## 2015-02-14 DIAGNOSIS — R7309 Other abnormal glucose: Secondary | ICD-10-CM

## 2015-02-14 LAB — POCT URINALYSIS DIP (MANUAL ENTRY)
BILIRUBIN UA: NEGATIVE
BILIRUBIN UA: NEGATIVE
Blood, UA: NEGATIVE
GLUCOSE UA: NEGATIVE
Leukocytes, UA: NEGATIVE
Nitrite, UA: NEGATIVE
Protein Ur, POC: NEGATIVE
SPEC GRAV UA: 1.02
UROBILINOGEN UA: 0.2
pH, UA: 6

## 2015-02-14 MED ORDER — RHO D IMMUNE GLOBULIN 1500 UNITS IM SOSY
1500.0000 [IU] | PREFILLED_SYRINGE | Freq: Once | INTRAMUSCULAR | Status: AC
  Administered 2015-02-14: 1500 [IU] via INTRAMUSCULAR

## 2015-02-14 MED ORDER — TETANUS-DIPHTH-ACELL PERTUSSIS 5-2.5-18.5 LF-MCG/0.5 IM SUSP
0.5000 mL | Freq: Once | INTRAMUSCULAR | Status: AC
Start: 2015-02-14 — End: 2015-02-14
  Administered 2015-02-14: 0.5 mL via INTRAMUSCULAR

## 2015-02-14 NOTE — Progress Notes (Signed)
ROB: Doing well. No complaints. Drank ginger ale this morning, needs to reschudule 3 hr glucola.  For Rhogam and Tdap today.  To receive flu vaccine through job tomorrow. For UCx for [facility] for UTI.  Blood consent signed. Given info on cord blood banking.  RTC in 2 weeks.

## 2015-02-16 LAB — URINE CULTURE, OB REFLEX

## 2015-02-16 LAB — CULTURE, OB URINE

## 2015-02-22 NOTE — Telephone Encounter (Signed)
error 

## 2015-02-28 ENCOUNTER — Ambulatory Visit (INDEPENDENT_AMBULATORY_CARE_PROVIDER_SITE_OTHER): Payer: BLUE CROSS/BLUE SHIELD | Admitting: Obstetrics and Gynecology

## 2015-02-28 ENCOUNTER — Encounter: Payer: Self-pay | Admitting: Obstetrics and Gynecology

## 2015-02-28 VITALS — BP 111/73 | HR 80 | Wt 202.0 lb

## 2015-02-28 DIAGNOSIS — O99210 Obesity complicating pregnancy, unspecified trimester: Secondary | ICD-10-CM

## 2015-02-28 DIAGNOSIS — Z3493 Encounter for supervision of normal pregnancy, unspecified, third trimester: Secondary | ICD-10-CM

## 2015-02-28 DIAGNOSIS — R7302 Impaired glucose tolerance (oral): Secondary | ICD-10-CM

## 2015-02-28 DIAGNOSIS — O2613 Low weight gain in pregnancy, third trimester: Secondary | ICD-10-CM

## 2015-02-28 DIAGNOSIS — R7309 Other abnormal glucose: Secondary | ICD-10-CM

## 2015-02-28 LAB — POCT URINALYSIS DIPSTICK
Bilirubin, UA: NEGATIVE
Glucose, UA: NEGATIVE
Ketones, UA: NEGATIVE
NITRITE UA: NEGATIVE
PROTEIN UA: NEGATIVE
RBC UA: NEGATIVE
SPEC GRAV UA: 1.02
UROBILINOGEN UA: NEGATIVE
pH, UA: 6

## 2015-02-28 NOTE — Progress Notes (Signed)
Pt states grandmother passed away, and is now just getting back to her normal routine and will have her 3hr gtt done.

## 2015-02-28 NOTE — Progress Notes (Signed)
ROB: Patient doing ok.  Mourning passing of grandmother (occured last Thursday), with adequate grief signs, no evidence of depressive symptoms.  Has not completed 3 hr GTT due to this.  Will reschedule.  Notes heavier vaginal discharge, but denies odor, itching/burning.  Given reassurance.  Poor weight gain in pregnancy.  Advised on Boost or Ensure.  RTC in 2 weeks.

## 2015-03-06 ENCOUNTER — Other Ambulatory Visit: Payer: BLUE CROSS/BLUE SHIELD

## 2015-03-07 LAB — GESTATIONAL GLUCOSE TOLERANCE
GLUCOSE 1 HOUR GTT: 173 mg/dL (ref 65–179)
GLUCOSE FASTING: 88 mg/dL (ref 65–94)
Glucose, GTT - 2 Hour: 147 mg/dL (ref 65–154)
Glucose, GTT - 3 Hour: 115 mg/dL (ref 65–139)

## 2015-03-09 ENCOUNTER — Telehealth: Payer: Self-pay | Admitting: Obstetrics and Gynecology

## 2015-03-09 NOTE — Telephone Encounter (Signed)
Pt called and she has had 3 episodes since last night, she is having what she describes breaking out in sweats, upper abdominal pain like almost really bad indigestion, she said it feels like it did when she had gallbladder attacks, and she did have her gallbladder removed last year. She is pregnant in late 2nd trimester, pt stated she has never had this in other pregnancy and not sure what to do, would like call back. She said when you call the work number that you will have to ask for her.

## 2015-03-10 ENCOUNTER — Inpatient Hospital Stay: Payer: BLUE CROSS/BLUE SHIELD

## 2015-03-10 ENCOUNTER — Encounter: Payer: Self-pay | Admitting: *Deleted

## 2015-03-10 ENCOUNTER — Inpatient Hospital Stay
Admission: EM | Admit: 2015-03-10 | Discharge: 2015-03-16 | DRG: 781 | Payer: BLUE CROSS/BLUE SHIELD | Attending: Obstetrics and Gynecology | Admitting: Obstetrics and Gynecology

## 2015-03-10 DIAGNOSIS — Z3A31 31 weeks gestation of pregnancy: Secondary | ICD-10-CM

## 2015-03-10 DIAGNOSIS — Z3492 Encounter for supervision of normal pregnancy, unspecified, second trimester: Secondary | ICD-10-CM

## 2015-03-10 DIAGNOSIS — O99343 Other mental disorders complicating pregnancy, third trimester: Secondary | ICD-10-CM | POA: Diagnosis present

## 2015-03-10 DIAGNOSIS — R1011 Right upper quadrant pain: Secondary | ICD-10-CM

## 2015-03-10 DIAGNOSIS — R0600 Dyspnea, unspecified: Secondary | ICD-10-CM

## 2015-03-10 DIAGNOSIS — R0602 Shortness of breath: Secondary | ICD-10-CM | POA: Diagnosis present

## 2015-03-10 DIAGNOSIS — Z8759 Personal history of other complications of pregnancy, childbirth and the puerperium: Secondary | ICD-10-CM

## 2015-03-10 DIAGNOSIS — Z91041 Radiographic dye allergy status: Secondary | ICD-10-CM

## 2015-03-10 DIAGNOSIS — R0689 Other abnormalities of breathing: Secondary | ICD-10-CM

## 2015-03-10 DIAGNOSIS — R7989 Other specified abnormal findings of blood chemistry: Secondary | ICD-10-CM | POA: Diagnosis present

## 2015-03-10 DIAGNOSIS — R0781 Pleurodynia: Secondary | ICD-10-CM | POA: Diagnosis present

## 2015-03-10 DIAGNOSIS — F419 Anxiety disorder, unspecified: Secondary | ICD-10-CM | POA: Diagnosis present

## 2015-03-10 DIAGNOSIS — R748 Abnormal levels of other serum enzymes: Secondary | ICD-10-CM

## 2015-03-10 DIAGNOSIS — K838 Other specified diseases of biliary tract: Secondary | ICD-10-CM | POA: Clinically undetermined

## 2015-03-10 DIAGNOSIS — O26893 Other specified pregnancy related conditions, third trimester: Principal | ICD-10-CM | POA: Diagnosis present

## 2015-03-10 LAB — URINALYSIS COMPLETE WITH MICROSCOPIC (ARMC ONLY)
Bilirubin Urine: NEGATIVE
GLUCOSE, UA: NEGATIVE mg/dL
Hgb urine dipstick: NEGATIVE
Ketones, ur: NEGATIVE mg/dL
NITRITE: NEGATIVE
Protein, ur: NEGATIVE mg/dL
Specific Gravity, Urine: 1.013 (ref 1.005–1.030)
pH: 6 (ref 5.0–8.0)

## 2015-03-10 LAB — COMPREHENSIVE METABOLIC PANEL
ALT: 39 U/L (ref 14–54)
ANION GAP: 7 (ref 5–15)
AST: 80 U/L — ABNORMAL HIGH (ref 15–41)
Albumin: 2.9 g/dL — ABNORMAL LOW (ref 3.5–5.0)
Alkaline Phosphatase: 171 U/L — ABNORMAL HIGH (ref 38–126)
BUN: 9 mg/dL (ref 6–20)
CHLORIDE: 103 mmol/L (ref 101–111)
CO2: 26 mmol/L (ref 22–32)
Calcium: 9.2 mg/dL (ref 8.9–10.3)
Creatinine, Ser: 0.67 mg/dL (ref 0.44–1.00)
GFR calc non Af Amer: >60 mL/min (ref >60)
Glucose, Bld: 118 mg/dL — ABNORMAL HIGH (ref 65–99)
Potassium: 4.2 mmol/L (ref 3.5–5.1)
SODIUM: 136 mmol/L (ref 135–145)
Total Bilirubin: 0.9 mg/dL (ref 0.3–1.2)
Total Protein: 7.1 g/dL (ref 6.5–8.1)

## 2015-03-10 LAB — CBC WITH DIFFERENTIAL/PLATELET
Basophils Absolute: 0.1 10*3/uL (ref 0–0.1)
Basophils Relative: 1 %
EOS ABS: 0.1 10*3/uL (ref 0–0.7)
EOS PCT: 1 %
HCT: 34 % — ABNORMAL LOW (ref 35.0–47.0)
Hemoglobin: 11.2 g/dL — ABNORMAL LOW (ref 12.0–16.0)
LYMPHS ABS: 1.7 10*3/uL (ref 1.0–3.6)
Lymphocytes Relative: 17 %
MCH: 29.2 pg (ref 26.0–34.0)
MCHC: 33 g/dL (ref 32.0–36.0)
MCV: 88.3 fL (ref 80.0–100.0)
MONOS PCT: 9 %
Monocytes Absolute: 0.9 10*3/uL (ref 0.2–0.9)
Neutro Abs: 7.4 10*3/uL — ABNORMAL HIGH (ref 1.4–6.5)
Neutrophils Relative %: 72 %
PLATELETS: 243 10*3/uL (ref 150–440)
RBC: 3.85 MIL/uL (ref 3.80–5.20)
RDW: 14.2 % (ref 11.5–14.5)
WBC: 10.1 10*3/uL (ref 3.6–11.0)

## 2015-03-10 LAB — PROTEIN / CREATININE RATIO, URINE
Creatinine, Urine: 105 mg/dL
PROTEIN CREATININE RATIO: 0.07 mg/mg{creat} (ref 0.00–0.15)
TOTAL PROTEIN, URINE: 7 mg/dL

## 2015-03-10 LAB — URIC ACID: Uric Acid, Serum: 4.7 mg/dL (ref 2.3–6.6)

## 2015-03-10 MED ORDER — MORPHINE SULFATE (PF) 2 MG/ML IV SOLN
2.0000 mg | Freq: Once | INTRAVENOUS | Status: AC
  Administered 2015-03-10: 2 mg via INTRAVENOUS

## 2015-03-10 MED ORDER — FAMOTIDINE 20 MG PO TABS
ORAL_TABLET | ORAL | Status: AC
  Administered 2015-03-11: 20 mg via ORAL
  Filled 2015-03-10: qty 1

## 2015-03-10 MED ORDER — METHYLPREDNISOLONE SODIUM SUCC 125 MG IJ SOLR
40.0000 mg | Freq: Once | INTRAMUSCULAR | Status: AC
Start: 2015-03-10 — End: 2015-03-10
  Administered 2015-03-10: 40 mg via INTRAVENOUS
  Filled 2015-03-10: qty 2

## 2015-03-10 MED ORDER — DIPHENHYDRAMINE HCL 50 MG/ML IJ SOLN
50.0000 mg | Freq: Once | INTRAMUSCULAR | Status: AC
  Administered 2015-03-11: 50 mg via INTRAVENOUS

## 2015-03-10 MED ORDER — FAMOTIDINE 20 MG PO TABS
20.0000 mg | ORAL_TABLET | Freq: Once | ORAL | Status: AC
  Administered 2015-03-11: 20 mg via ORAL
  Filled 2015-03-10: qty 1

## 2015-03-10 MED ORDER — MORPHINE SULFATE (PF) 2 MG/ML IV SOLN
INTRAVENOUS | Status: AC
  Administered 2015-03-10: 2 mg via INTRAVENOUS
  Filled 2015-03-10: qty 1

## 2015-03-10 MED ORDER — DIPHENHYDRAMINE HCL 50 MG/ML IJ SOLN
INTRAMUSCULAR | Status: AC
  Administered 2015-03-11: 50 mg via INTRAVENOUS
  Filled 2015-03-10: qty 1

## 2015-03-10 MED ORDER — LACTATED RINGERS IV BOLUS (SEPSIS)
250.0000 mL | Freq: Once | INTRAVENOUS | Status: AC
  Administered 2015-03-10: 250 mL via INTRAVENOUS

## 2015-03-10 MED ORDER — MORPHINE SULFATE (PF) 2 MG/ML IV SOLN
2.0000 mg | INTRAVENOUS | Status: DC | PRN
  Administered 2015-03-10 – 2015-03-14 (×18): 2 mg via INTRAVENOUS
  Filled 2015-03-10 (×18): qty 1

## 2015-03-10 NOTE — H&P (Signed)
Brenda Leonard is a 34 y.o. female presenting for admission at 30.[redacted] weeks gestation for acute dyspnea with right sided pleuritic chest pain. Symptoms began 48 hours ago with 6 episodes of right upper quadrant discomfort and shortness of breath with difficulty breathing. No history of chest trauma, upper respiratory infection, cold, nausea, vomiting, diarrhea, blood in stool. Patient did fall several weeks ago without long-term sequela. She is status post cholecystectomy one year ago; during that hospitalization she had an MRI with a reaction that caused shortness of breath. Maternal Medical History:  Reason for admission: Nausea.    OB History    Gravida Para Term Preterm AB TAB SAB Ectopic Multiple Living   3 2 2       2      Past Medical History  Diagnosis Date  . Vaginal Pap smear, abnormal 2013   Past Surgical History  Procedure Laterality Date  . Gallbladder surgery  11/2013   Family History: family history includes Heart disease in her sister. Social History:  reports that she has never smoked. She has never used smokeless tobacco. She reports that she does not drink alcohol or use illicit drugs.    Prenatal Transfer Tool  Maternal Diabetes: No Genetic Screening: Normal Maternal Ultrasounds/Referrals: Normal Fetal Ultrasounds or other Referrals:  None Maternal Substance Abuse:  No Significant Maternal Medications:  None Significant Maternal Lab Results:  None Other Comments:  None  Review of Systems  Constitutional: Negative for fever, chills, malaise/fatigue and diaphoresis.  HENT: Negative for congestion and sore throat.   Eyes: Negative for blurred vision, double vision and photophobia.  Respiratory: Positive for shortness of breath. Negative for cough, hemoptysis and wheezing.   Cardiovascular: Positive for chest pain and orthopnea. Negative for palpitations, claudication and leg swelling.  Gastrointestinal: Positive for abdominal pain. Negative for nausea,  vomiting, diarrhea, constipation, blood in stool and melena.  Genitourinary: Negative for dysuria, urgency and frequency.  Musculoskeletal: Positive for back pain. Negative for myalgias, joint pain and neck pain.  Skin: Negative for itching and rash.  Neurological: Negative for dizziness, tingling, tremors, loss of consciousness, weakness and headaches.  Psychiatric/Behavioral: The patient is nervous/anxious.       Blood pressure 125/70, pulse 73, temperature 97.7 F (36.5 C), temperature source Oral, resp. rate 25, last menstrual period 08/09/2014, SpO2 100 %. Exam Physical Exam  Pleasant but anxious white female, uncomfortable with respirations HEENT exam-normocephalic atraumatic; neck supple without thyromegaly or adenopathy Lungs: Respiratory rate 25; shallow breathing; symmetric breath sounds; no wheezes or rhonchi Back: No CVA tenderness Heart: Regular rate and rhythm without murmur Abdomen: Gravid; fetal heart rate normal; uterus 1/4 tender; no palpable contractions Pelvic exam: Deferred Extremities: Trace edema; no palpable cords, no calf tenderness, no popliteal fossa tenderness, warm and dry Skin: No rash Musculoskeletal: Normal Prenatal labs: ABO, Rh: A/Negative/-- (05/20 0000) Antibody: Negative (05/20 0000) Rubella: Nonimmune (05/20 0000) RPR: Nonreactive (05/20 0000)  HBsAg: Negative (05/20 0000)  HIV:    GBS:     Assessment/Plan: 1.  30.3 week IUP 2. Dyspnea with pleuritic chest pain and right upper quadrant pain, unclear etiology, sudden onset 3. Past history of preeclampsia in first pregnancy 4. Cannot rule out PE. Patient has no tachycardia, mild tachypnea, normal O2 saturation, BUT elevated AST, and anxiety with visible difficulty breathing. 4. History of I iodine contrast allergy; on further exploration, the patient had shortness of breath during an MRI scan for her cholecystitis workup; radiology states that Galladfinium is the chemical used for MRIs,  not  iodinated reagents. After extensive discussion we decided to proceed with CT scan to rule out PE, doing prophylaxis for prevention of anaphylaxis.  Plan: 1. NST-reactive 2. Pulse oximetry-99% 3. PA/lateral chest x-ray-normal 4. CBC-normal white blood cell count, normal platelets, normal H&H 5. CMP-elevated AST (80) 6. CT scan to rule out PE; Solu-Medrol 40 mg IV one hour prior to contrast, Benadryl 50 mg IV on call to radiology, and Pepcid 20 mg by mouth on call to radiology   Brenda Leonard 03/10/2015, 11:19 PM

## 2015-03-11 ENCOUNTER — Inpatient Hospital Stay: Payer: BLUE CROSS/BLUE SHIELD

## 2015-03-11 DIAGNOSIS — R0600 Dyspnea, unspecified: Secondary | ICD-10-CM | POA: Diagnosis present

## 2015-03-11 LAB — COMPREHENSIVE METABOLIC PANEL
ALK PHOS: 170 U/L — AB (ref 38–126)
ALT: 54 U/L (ref 14–54)
AST: 90 U/L — AB (ref 15–41)
Albumin: 2.6 g/dL — ABNORMAL LOW (ref 3.5–5.0)
Anion gap: 7 (ref 5–15)
BILIRUBIN TOTAL: 1.2 mg/dL (ref 0.3–1.2)
BUN: 8 mg/dL (ref 6–20)
CALCIUM: 8.9 mg/dL (ref 8.9–10.3)
CHLORIDE: 106 mmol/L (ref 101–111)
CO2: 23 mmol/L (ref 22–32)
CREATININE: 0.62 mg/dL (ref 0.44–1.00)
Glucose, Bld: 171 mg/dL — ABNORMAL HIGH (ref 65–99)
Potassium: 4.3 mmol/L (ref 3.5–5.1)
Sodium: 136 mmol/L (ref 135–145)
TOTAL PROTEIN: 6.7 g/dL (ref 6.5–8.1)

## 2015-03-11 LAB — CBC WITH DIFFERENTIAL/PLATELET
BASOS ABS: 0 10*3/uL (ref 0–0.1)
BASOS PCT: 0 %
EOS ABS: 0 10*3/uL (ref 0–0.7)
EOS PCT: 0 %
HCT: 29.5 % — ABNORMAL LOW (ref 35.0–47.0)
HEMOGLOBIN: 10.1 g/dL — AB (ref 12.0–16.0)
Lymphocytes Relative: 8 %
Lymphs Abs: 0.7 10*3/uL — ABNORMAL LOW (ref 1.0–3.6)
MCH: 29.9 pg (ref 26.0–34.0)
MCHC: 34.2 g/dL (ref 32.0–36.0)
MCV: 87.5 fL (ref 80.0–100.0)
Monocytes Absolute: 0.1 10*3/uL — ABNORMAL LOW (ref 0.2–0.9)
Monocytes Relative: 1 %
NEUTROS PCT: 91 %
Neutro Abs: 8.4 10*3/uL — ABNORMAL HIGH (ref 1.4–6.5)
PLATELETS: 202 10*3/uL (ref 150–440)
RBC: 3.38 MIL/uL — AB (ref 3.80–5.20)
RDW: 14 % (ref 11.5–14.5)
WBC: 9.2 10*3/uL (ref 3.6–11.0)

## 2015-03-11 LAB — SEDIMENTATION RATE: SED RATE: 54 mm/h — AB (ref 0–20)

## 2015-03-11 MED ORDER — ONDANSETRON HCL 4 MG/2ML IJ SOLN: 4.0000 mg | Freq: Four times a day (QID) | INTRAMUSCULAR | Status: DC | PRN

## 2015-03-11 MED ORDER — NALOXONE HCL 0.4 MG/ML IJ SOLN: 0.4000 mg | INTRAMUSCULAR | Status: DC | PRN

## 2015-03-11 MED ORDER — ZOLPIDEM TARTRATE 5 MG PO TABS: 5.0000 mg | ORAL_TABLET | Freq: Every evening | ORAL | Status: DC | PRN

## 2015-03-11 MED ORDER — MORPHINE SULFATE 2 MG/ML IV SOLN
INTRAVENOUS | Status: DC
  Administered 2015-03-11: 3.75 mg via INTRAVENOUS
  Administered 2015-03-11: 1.5 mg via INTRAVENOUS
  Administered 2015-03-11: 16:00:00 via INTRAVENOUS
  Administered 2015-03-11: 3.75 mg via INTRAVENOUS
  Administered 2015-03-12: 0 via INTRAVENOUS
  Administered 2015-03-12: 8.25 mg via INTRAVENOUS
  Administered 2015-03-12: 5.25 mg via INTRAVENOUS
  Filled 2015-03-11 (×2): qty 25

## 2015-03-11 MED ORDER — ACETAMINOPHEN 325 MG PO TABS: 650.0000 mg | ORAL_TABLET | ORAL | Status: DC | PRN

## 2015-03-11 MED ORDER — DIPHENHYDRAMINE HCL 12.5 MG/5ML PO ELIX
12.5000 mg | ORAL_SOLUTION | Freq: Four times a day (QID) | ORAL | Status: DC | PRN
  Filled 2015-03-11: qty 5

## 2015-03-11 MED ORDER — PRENATAL MULTIVITAMIN CH
1.0000 | ORAL_TABLET | Freq: Every day | ORAL | Status: DC
  Administered 2015-03-11 – 2015-03-16 (×6): 1 via ORAL
  Filled 2015-03-11 (×6): qty 1

## 2015-03-11 MED ORDER — FAMOTIDINE 40 MG/5ML PO SUSR
20.0000 mg | Freq: Two times a day (BID) | ORAL | Status: DC
  Administered 2015-03-12 – 2015-03-15 (×8): 20 mg via ORAL
  Filled 2015-03-11 (×13): qty 2.5

## 2015-03-11 MED ORDER — DOCUSATE SODIUM 100 MG PO CAPS
100.0000 mg | ORAL_CAPSULE | Freq: Every day | ORAL | Status: DC
  Administered 2015-03-11 – 2015-03-16 (×6): 100 mg via ORAL
  Filled 2015-03-11 (×6): qty 1

## 2015-03-11 MED ORDER — IOHEXOL 350 MG/ML SOLN: 75.0000 mL | Freq: Once | INTRAVENOUS | Status: DC | PRN

## 2015-03-11 MED ORDER — DIPHENHYDRAMINE HCL 50 MG/ML IJ SOLN
12.5000 mg | Freq: Four times a day (QID) | INTRAMUSCULAR | Status: DC | PRN
  Administered 2015-03-11 – 2015-03-12 (×4): 12.5 mg via INTRAVENOUS
  Filled 2015-03-11 (×4): qty 1

## 2015-03-11 MED ORDER — ASPIRIN 81 MG PO CHEW
81.0000 mg | CHEWABLE_TABLET | Freq: Every day | ORAL | Status: DC
  Administered 2015-03-11 – 2015-03-16 (×6): 81 mg via ORAL
  Filled 2015-03-11 (×6): qty 1

## 2015-03-11 MED ORDER — OXYCODONE-ACETAMINOPHEN 5-325 MG PO TABS
1.0000 | ORAL_TABLET | ORAL | Status: DC | PRN
  Administered 2015-03-12 (×2): 1 via ORAL
  Administered 2015-03-13 – 2015-03-15 (×11): 2 via ORAL
  Filled 2015-03-11 (×6): qty 2
  Filled 2015-03-11: qty 1
  Filled 2015-03-11 (×6): qty 2

## 2015-03-11 MED ORDER — SODIUM CHLORIDE 0.9 % IJ SOLN: 9.0000 mL | INTRAMUSCULAR | Status: DC | PRN

## 2015-03-11 MED ORDER — CALCIUM CARBONATE ANTACID 500 MG PO CHEW
2.0000 | CHEWABLE_TABLET | ORAL | Status: DC | PRN
  Administered 2015-03-11 – 2015-03-14 (×11): 400 mg via ORAL
  Filled 2015-03-11 (×11): qty 2

## 2015-03-11 MED ORDER — ALUM & MAG HYDROXIDE-SIMETH 200-200-20 MG/5ML PO SUSP
30.0000 mL | Freq: Once | ORAL | Status: AC
  Administered 2015-03-12: 30 mL via ORAL
  Filled 2015-03-11: qty 30

## 2015-03-11 NOTE — Progress Notes (Signed)
HOSPITAL DAY 1 33.4 week IUP  SUBJECTIVE: Patient resting comfortably today. Breathing is unlabored. Still has episodes of right upper quadrant pain and dyspnea. Tolerating diet.   OBJECTIVE: BP 101/50 mmHg  Pulse 83  Temp(Src) 97.9 F (36.6 C) (Oral)  Resp 18  SpO2 96%  LMP 08/09/2014 (Approximate) Pleasant well-appearing white female in no acute distress. Alert and oriented. Respiratory rate 18, unlabored, patient capable of performing full inspiration without discomfort today Lungs: Clear Heart: Regular rate and rhythm without murmur; S1 and split S2 are noted Uterus: Nontender; fetus active; fetal heart rate normal Extremities: Nontender, trace edema  CMP Latest Ref Rng 03/11/2015 03/10/2015 10/06/2013  Glucose 65 - 99 mg/dL 171(H) 118(H) 92  BUN 6 - 20 mg/dL '8 9 16  ' Creatinine 0.44 - 1.00 mg/dL 0.62 0.67 0.97  Sodium 135 - 145 mmol/L 136 136 138  Potassium 3.5 - 5.1 mmol/L 4.3 4.2 4.3  Chloride 101 - 111 mmol/L 106 103 105  CO2 22 - 32 mmol/L '23 26 27  ' Calcium 8.9 - 10.3 mg/dL 8.9 9.2 9.2  Total Protein 6.5 - 8.1 g/dL 6.7 7.1 8.0  Total Bilirubin 0.3 - 1.2 mg/dL 1.2 0.9 0.3  Alkaline Phos 38 - 126 U/L 170(H) 171(H) 104  AST 15 - 41 U/L 90(H) 80(H) 45(H)  ALT 14 - 54 U/L 54 39 26   CBC Latest Ref Rng 03/11/2015 03/10/2015 11/23/2014  WBC 3.6 - 11.0 K/uL 9.2 10.1 -  Hemoglobin 12.0 - 16.0 g/dL 10.1(L) 11.2(L) -  Hematocrit 35.0 - 47.0 % 29.5(L) 34.0(L) 35.5  Platelets 150 - 440 K/uL 202 243 -   ASSESSMENT: 1. 33.4 week IUP, stable 2. Episode of acute dyspnea and right upper quadrant pain, unclear etiology; normal chest x-ray; normal CT with contrast to rule out PE 3. Elevated AST and borderline elevated ALT, uncertain significance 4. History of preeclampsia in first pregnancy; no evidence of evolving preeclampsia at this time with normal blood pressure, negative urine protein, normal urine protein creatinine ratio, normal platelets, normal H&H (although with hydration  hemoglobin dropped 1 g overnight. No clinical signs of preeclampsia.  PLAN: 1. Hepatitis panel 2. Viral panel 3. ESR 4. Begin Percocet oral narcotic therapy; discontinue PCA morphine 5. Repeat CBC and CMP in a.m.  Brayton Mars, MD

## 2015-03-12 ENCOUNTER — Observation Stay: Payer: BLUE CROSS/BLUE SHIELD

## 2015-03-12 LAB — CBC WITH DIFFERENTIAL/PLATELET
Basophils Absolute: 0 10*3/uL (ref 0–0.1)
Basophils Relative: 1 %
Eosinophils Absolute: 0 10*3/uL (ref 0–0.7)
Eosinophils Relative: 1 %
HCT: 26.7 % — ABNORMAL LOW (ref 35.0–47.0)
HEMOGLOBIN: 9 g/dL — AB (ref 12.0–16.0)
LYMPHS ABS: 1.2 10*3/uL (ref 1.0–3.6)
Lymphocytes Relative: 18 %
MCH: 29.8 pg (ref 26.0–34.0)
MCHC: 33.8 g/dL (ref 32.0–36.0)
MCV: 88.2 fL (ref 80.0–100.0)
Monocytes Absolute: 0.5 10*3/uL (ref 0.2–0.9)
Monocytes Relative: 7 %
Neutro Abs: 5.2 10*3/uL (ref 1.4–6.5)
Neutrophils Relative %: 73 %
Platelets: 165 10*3/uL (ref 150–440)
RBC: 3.02 MIL/uL — AB (ref 3.80–5.20)
RDW: 14.2 % (ref 11.5–14.5)
WBC: 7 10*3/uL (ref 3.6–11.0)

## 2015-03-12 LAB — COMPREHENSIVE METABOLIC PANEL
ALK PHOS: 154 U/L — AB (ref 38–126)
ALT: 53 U/L (ref 14–54)
AST: 72 U/L — ABNORMAL HIGH (ref 15–41)
Albumin: 2.3 g/dL — ABNORMAL LOW (ref 3.5–5.0)
Anion gap: 6 (ref 5–15)
BILIRUBIN TOTAL: 0.2 mg/dL — AB (ref 0.3–1.2)
BUN: 7 mg/dL (ref 6–20)
CALCIUM: 8.5 mg/dL — AB (ref 8.9–10.3)
CO2: 25 mmol/L (ref 22–32)
CREATININE: 0.54 mg/dL (ref 0.44–1.00)
Chloride: 107 mmol/L (ref 101–111)
Glucose, Bld: 117 mg/dL — ABNORMAL HIGH (ref 65–99)
Potassium: 3.9 mmol/L (ref 3.5–5.1)
Sodium: 138 mmol/L (ref 135–145)
TOTAL PROTEIN: 5.8 g/dL — AB (ref 6.5–8.1)

## 2015-03-12 MED ORDER — DIPHENHYDRAMINE HCL 25 MG PO CAPS
25.0000 mg | ORAL_CAPSULE | Freq: Four times a day (QID) | ORAL | Status: DC | PRN
  Administered 2015-03-12 – 2015-03-15 (×7): 25 mg via ORAL
  Filled 2015-03-12 (×7): qty 1

## 2015-03-12 NOTE — Telephone Encounter (Signed)
Left message on 03/09/15 to contact office.  Pt has since been admitted to [facility] for Dyspnea.

## 2015-03-12 NOTE — Progress Notes (Signed)
Leim Fabryatherine Gaither, RN notified of need for NST today

## 2015-03-13 DIAGNOSIS — Z91041 Radiographic dye allergy status: Secondary | ICD-10-CM | POA: Diagnosis not present

## 2015-03-13 DIAGNOSIS — O26893 Other specified pregnancy related conditions, third trimester: Secondary | ICD-10-CM | POA: Diagnosis present

## 2015-03-13 DIAGNOSIS — F419 Anxiety disorder, unspecified: Secondary | ICD-10-CM | POA: Diagnosis present

## 2015-03-13 DIAGNOSIS — R0600 Dyspnea, unspecified: Secondary | ICD-10-CM | POA: Diagnosis present

## 2015-03-13 DIAGNOSIS — R7989 Other specified abnormal findings of blood chemistry: Secondary | ICD-10-CM | POA: Diagnosis present

## 2015-03-13 DIAGNOSIS — R748 Abnormal levels of other serum enzymes: Secondary | ICD-10-CM | POA: Diagnosis not present

## 2015-03-13 DIAGNOSIS — O99343 Other mental disorders complicating pregnancy, third trimester: Secondary | ICD-10-CM | POA: Diagnosis present

## 2015-03-13 DIAGNOSIS — R1011 Right upper quadrant pain: Secondary | ICD-10-CM | POA: Diagnosis not present

## 2015-03-13 DIAGNOSIS — R0602 Shortness of breath: Secondary | ICD-10-CM | POA: Diagnosis present

## 2015-03-13 DIAGNOSIS — R0781 Pleurodynia: Secondary | ICD-10-CM | POA: Diagnosis present

## 2015-03-13 DIAGNOSIS — Z3A31 31 weeks gestation of pregnancy: Secondary | ICD-10-CM | POA: Diagnosis not present

## 2015-03-13 LAB — COMPREHENSIVE METABOLIC PANEL
ALBUMIN: 2.3 g/dL — AB (ref 3.5–5.0)
ALK PHOS: 202 U/L — AB (ref 38–126)
ALT: 99 U/L — AB (ref 14–54)
AST: 100 U/L — ABNORMAL HIGH (ref 15–41)
Anion gap: 7 (ref 5–15)
BUN: 6 mg/dL (ref 6–20)
CALCIUM: 8.3 mg/dL — AB (ref 8.9–10.3)
CO2: 23 mmol/L (ref 22–32)
CREATININE: 0.59 mg/dL (ref 0.44–1.00)
Chloride: 106 mmol/L (ref 101–111)
GFR calc Af Amer: >60 mL/min (ref >60)
GFR calc non Af Amer: >60 mL/min (ref >60)
GLUCOSE: 115 mg/dL — AB (ref 65–99)
Potassium: 3.4 mmol/L — ABNORMAL LOW (ref 3.5–5.1)
Sodium: 136 mmol/L (ref 135–145)
Total Bilirubin: 0.3 mg/dL (ref 0.3–1.2)
Total Protein: 5.9 g/dL — ABNORMAL LOW (ref 6.5–8.1)

## 2015-03-13 LAB — CBC WITH DIFFERENTIAL/PLATELET
BASOS PCT: 1 %
Basophils Absolute: 0 10*3/uL (ref 0–0.1)
EOS ABS: 0.1 10*3/uL (ref 0–0.7)
Eosinophils Relative: 2 %
HCT: 27.5 % — ABNORMAL LOW (ref 35.0–47.0)
HEMOGLOBIN: 9.4 g/dL — AB (ref 12.0–16.0)
LYMPHS ABS: 1.3 10*3/uL (ref 1.0–3.6)
Lymphocytes Relative: 20 %
MCH: 30.1 pg (ref 26.0–34.0)
MCHC: 34 g/dL (ref 32.0–36.0)
MCV: 88.8 fL (ref 80.0–100.0)
Monocytes Absolute: 0.6 10*3/uL (ref 0.2–0.9)
Monocytes Relative: 9 %
NEUTROS PCT: 68 %
Neutro Abs: 4.3 10*3/uL (ref 1.4–6.5)
Platelets: 174 10*3/uL (ref 150–440)
RBC: 3.1 MIL/uL — AB (ref 3.80–5.20)
RDW: 14.5 % (ref 11.5–14.5)
WBC: 6.3 10*3/uL (ref 3.6–11.0)

## 2015-03-13 LAB — LIPASE, BLOOD: Lipase: 18 U/L (ref 11–51)

## 2015-03-13 NOTE — Progress Notes (Signed)
Patient ID: Brenda Leonard, female   DOB: 05/12/1980, 34 y.o.   MRN: [id number]  SUBJECTIVE Patient says she feels well today, pain was controlled throughout the night. Occasionally still gets SOB with RUQ pain when moving in certain positions, but breathing is unlabored. Urinating normally. No fevers, chills, chest pain, leg swelling/pain, HA, vision changes.   OBJECTIVE Blood pressure 113/54, pulse 80, temperature 97.4 F (36.3 C), temperature source Oral, resp. rate 18, last menstrual period 08/09/2014, SpO2 99 %.  Physical Exam  Constitutional: She is oriented to person, place, and time and well-developed, well-nourished, and in no distress.  Neck: Normal range of motion.  Cardiovascular: Normal rate, regular rhythm and normal heart sounds.   Pulmonary/Chest: Breath sounds normal. No respiratory distress.  Abdominal:  Gravid abdomen. RUQ tenderness  Musculoskeletal:  Trace edema LE b/l  Neurological: She is alert and oriented to person, place, and time.  Skin: Skin is warm and dry.  Psychiatric: Mood and affect normal.   Pertinent labs:  CBC CBC Latest Ref Rng 03/13/2015 03/12/2015 03/11/2015  WBC 3.6 - 11.0 K/uL 6.3 7.0 9.2  Hemoglobin 12.0 - 16.0 g/dL 4.0(J9.4(L) 8.1(X9.0(L) 10.1(L)  Hematocrit 35.0 - 47.0 % 27.5(L) 26.7(L) 29.5(L)  Platelets 150 - 440 K/uL 174 165 202    CMP  CMP Latest Ref Rng 03/13/2015 03/12/2015 03/11/2015  Glucose 65 - 99 mg/dL 914(N115(H) 829(F117(H) 621(H171(H)  BUN 6 - 20 mg/dL 6 7 8   Creatinine 0.44 - 1.00 mg/dL 0.860.59 5.780.54 4.690.62  Sodium 135 - 145 mmol/L 136 138 136  Potassium 3.5 - 5.1 mmol/L 3.4(L) 3.9 4.3  Chloride 101 - 111 mmol/L 106 107 106  CO2 22 - 32 mmol/L 23 25 23   Calcium 8.9 - 10.3 mg/dL 8.3(L) 8.5(L) 8.9  Total Protein 6.5 - 8.1 g/dL 5.9(L) 5.8(L) 6.7  Total Bilirubin 0.3 - 1.2 mg/dL 0.3 6.2(X0.2(L) 1.2  Alkaline Phos 38 - 126 U/L 202(H) 154(H) 170(H)  AST 15 - 41 U/L 100(H) 72(H) 90(H)  ALT 14 - 54 U/L 99(H) 53 54    Imaging: Abdominal Ultrasound  10/32/2016: IMPRESSION: Gallbladder absent. There is generalized biliary ductal cyst dilatation without mass or calculus demonstrable. Etiology for this degree of biliary ductal dilatation is uncertain. MRCP could be helpful for further evaluation in this regard. No focal liver lesions are appreciable by ultrasound.   ASSESSMENT/PLAN: 1. 30.6 week IUP - NST yesterday reassuring 2. RUQ pain with unclear etiology, mildly improving - Ultrasound shows no liver source, also showed dilatation of common bile duct with no clear etiology. GI consult today. 3. Abnormal labs: Elevated liver enzymes, low platelets, low hemoglobin -  Concern for developing HELLP/preeclampsia but normal BP, no protein in urine reassuring. Continue to monitor. 4. Hepatitis/viral panels pending;  5. Anemia - hemoglobin stable, continue to monitor  Doreene NestElena Klaus, PA-S Herold HarmsMartin A Roxi Hlavaty, MD   I have seen, interviewed, and examined the patient in conjunction with the [employer] and affirm the diagnosis and management plan. Delayna Sparlin A. Thurl Boen, MD, Evern CoreFACOG

## 2015-03-13 NOTE — Consult Note (Signed)
GI Inpatient Consult Note  Reason for Consult: RUQ pain and elevated liver enzymes   Attending Requesting Consult: Cherry  History of Present Illness: Brenda Leonard is a 34 y.o. female at 35 weeks/6 days gestation admitted for acute dyspnea and R-sided pleuritic chest pain on 10/29, also with RUQ pain and elevated liver enzymes.  Patient states she had acute onset dyspnea and R-sided chest pain with deep breaths on Thursday.  After the episode subsided, she tried avoiding carbonated beverages, greasy/spicy foods, and taking OTC antacids to prevent reflux or indigestion.  However, these measures did not provide relief.  She continued to experience severe episodes of symptoms daily, so she presented to the [facility] ED.  Labs showed Hgb 11.2, AST 80, alk phos 171, otherwise normal.  Vital signs also stable (h/o pre-eclampsia in previous pregnancy).  CT chest was negative for PE.  She was admitted for further evaluation and management.  Since admission, labs remained largely unimproved.  Morning labs showed Hgb 9.4, AST 100, ALT 99, and alk phos 202.  A severe episode of pain prompted a RUQ [place] last night (s/p cholecystectomy), which showed generalized biliary ductal dilation w/o evidence of mass or stone.  The CBD is dilated up to 24m.  Today, [name] severe episodes of pain continue without any identifiable triggers. She states the pain is in the epigastrium and RUQ, radiating around to her R-midback and R shoulder blade.  The pain progressively builds to the point that she has trouble taking a deep breath.  Episodes can last 1-2 hours before beginning to subside. The pain was helped last night with Morphine q 2-3 hrs, and she does not feel that po Percocet has been as effective in staying ahead of the pain.  Once pain has resolved, she feels "normal", but expresses concern that she has no warning before the pain starts to build.  She is also concerned that when episodes are severe and  breathing is painful, the baby may not be getting enough oxygen.  She denies associated symptoms like nausea, vomiting, constipation, diarrhea, hematochezia, or melena.  Also no history of liver-related problems like abdominal swelling, LE swelling, jaundice, itching, confusion, or anemia.  Past Medical History:  Past Medical History  Diagnosis Date  . Vaginal Pap smear, abnormal 2013    Problem List: Patient Active Problem List   Diagnosis Date Noted  . Dyspnea 03/11/2015  . Dyspnea and respiratory abnormality 03/10/2015  . Supervision of normal pregnancy in second trimester 02/14/2015  . Need for rhogam due to Rh negative mother 02/14/2015  . Abnormal Pap smear of cervix 10/26/2014  . Anxiety disorder 10/26/2014  . History of UTI 10/26/2014  . Anxiety and depression 02/13/2014  . Adiposity 02/13/2014  . Biliary calculi, common bile duct 11/22/2013    Past Surgical History: Past Surgical History  Procedure Laterality Date  . Gallbladder surgery  11/2013    Allergies: Allergies  Allergen Reactions  . Iodinated Diagnostic Agents     Home Medications: Prescriptions prior to admission  Medication Sig Dispense Refill Last Dose  . aspirin EC 81 MG tablet Take 1 tablet (81 mg total) by mouth daily. Take after 12 weeks for prevention of preeclampssia later in pregnancy (Patient not taking: Reported on 03/10/2015) 300 tablet 2 Not Taking at Unknown time  . Prenatal Vit-Fe Fumarate-FA (MULTIVITAMIN-PRENATAL) 27-0.8 MG TABS tablet Take 1 tablet by mouth daily at 12 noon.   Not Taking at Unknown time   Home medication reconciliation was completed  with the patient.   Scheduled Inpatient Medications:   . aspirin  81 mg Oral Daily  . docusate sodium  100 mg Oral Daily  . famotidine  20 mg Oral BID  . prenatal multivitamin  1 tablet Oral Q1200    Continuous Inpatient Infusions:     PRN Inpatient Medications:  acetaminophen, calcium carbonate, diphenhydrAMINE, iohexol, morphine  injection, oxyCODONE-acetaminophen, zolpidem  Family History: family history includes Heart disease in her sister.   Social History:   reports that she has never smoked. She has never used smokeless tobacco. She reports that she does not drink alcohol or use illicit drugs..   Review of Systems: Constitutional: Weight is stable.  Eyes: No changes in vision. ENT: No oral lesions, sore throat.  GI: see HPI.  Heme/Lymph: No easy bruising.  CV: + CP  GU: No hematuria.  Integumentary: No rashes.  Neuro: No headaches.  Psych: No depression/anxiety.  Endocrine: No heat/cold intolerance.  Allergic/Immunologic: + Itching Resp: No cough; + SOB Musculoskeletal: + mid-back pain   Physical Examination: BP 113/54 mmHg  Pulse 80  Temp(Src) 97.4 F (36.3 C) (Oral)  Resp 18  SpO2 99%  LMP 08/09/2014 (Approximate) Gen: NAD, alert and oriented x 4, sitting up in bed but appearing uncomfortable HEENT: PEERLA, EOMI, Neck: supple, no JVD or thyromegaly Chest: CTA bilaterally, no wheezes, crackles, or other adventitious sounds CV: RRR, no m/g/c/r Abd: soft, mild tenderness to moderate palpation of RUQ, ND, +BS in all four quadrants; no HSM, guarding, ridigity, or rebound tenderness Ext: no edema, well perfused with 2+ pulses, Skin: no rash or lesions noted Lymph: no LAD  Data: Lab Results  Component Value Date   WBC 6.3 03/13/2015   HGB 9.4* 03/13/2015   HCT 27.5* 03/13/2015   MCV 88.8 03/13/2015   PLT 174 03/13/2015    Recent Labs Lab 03/11/15 0605 03/12/15 0415 03/13/15 0653  HGB 10.1* 9.0* 9.4*   Lab Results  Component Value Date   NA 136 03/13/2015   K 3.4* 03/13/2015   CL 106 03/13/2015   CO2 23 03/13/2015   BUN 6 03/13/2015   CREATININE 0.59 03/13/2015   Lab Results  Component Value Date   ALT 99* 03/13/2015   AST 100* 03/13/2015   ALKPHOS 202* 03/13/2015   BILITOT 0.3 03/13/2015   No results for input(s): APTT, INR, PTT in the last 168  hours. Assessment/Plan: Brenda Leonard is a 34 y.o. female at 80 weeks/6 days gestation admitted for acute dyspnea and R-sided pleuritic chest pain on 10/29, also with RUQ pain and elevated liver enzymes.  Alk phos and AST mildly elevated but stable during first three days of admission, this morning AST and alk phos elevations increased along with new elevated ALT.  Hgb also decreased from 11.2 to 9.4 over this time, but BP has remained stable.  RUQ US showed generalized biliary ductal dilatation with CBD at 73m.  This is significant dilation for a patient s/p cholecystectomy.  I recommend a lipase to r/o pancreatitis, as well as an MRCP w/o contrast to evaluate for CBD stone (due to patient-reported facial swelling with previous MRI).  In the interim, continue IV pain control as needed.  Will continue to follow and make further recommendations as needed.  Recommendations: - Lipase ordered to r/o pancreatitis - MRCP w/o contrast ordered to r/o CBD stone - Continue IV pain control prn   Thank you for the consult. We will follow along with you. Please call with questions or concerns.  Lavera Guise, PA-C [facility] Phone: [phone] Pager: [phone]

## 2015-03-13 NOTE — Progress Notes (Signed)
Antenatal Progress Note  Subjective:     Patient ID: Brenda Leonard is a 34 y.o. female [redacted]w[redacted]d Estimated Date of Delivery: 05/16/15 by Patient's last menstrual period was 08/09/2014 (approximate). consistent with 1st trimester sono who was admitted for RUQ pain.  HD#4.   Subjective:  Patient denies complaints this morning.  Notes severe episode of RUQ pain last night, prompting imaging (RUQ sono).  Otherwise tolerating diet.    Review of Systems Denies contractions, leakage of fluids, vaginal bleeding, and reports good fetal movement.     Objective:   Filed Vitals:   03/12/15 1958 03/12/15 2300 03/13/15 0304 03/13/15 0747  BP: 108/57 119/69 96/48 104/47  Pulse: 83 85 73 77  Temp: 98.2 F (36.8 C) 98.2 F (36.8 C) 98.3 F (36.8 C) 98.1 F (36.7 C)  TempSrc: Oral Axillary Oral Oral  Resp: _0 SpO2: 100% 99% 95% 99%    General appearance: alert and no distress Lungs: clear to auscultation bilaterally Heart: regular rate and rhythm, S1, S2 normal, no murmur, click, rub or gallop Abdomen: soft, non-tender; bowel sounds normal; no masses,  no organomegaly Pelvic: deferred Extremities: extremities normal, atraumatic, no cyanosis or edema   FHT: present.    Labs:  Results for TMARNA, WENIGER(MRN [id number] as of 03/13/2015 09:58  Ref. Range 03/11/2015 06:05 03/12/2015 04:15 03/13/2015 06:53  Sodium Latest Ref Range: 135-145 mmol/L 136 138 136  Potassium Latest Ref Range: 3.5-5.1 mmol/L 4.3 3.9 3.4 (L)  Chloride Latest Ref Range: 101-111 mmol/L 106 107 106  CO2 Latest Ref Range: 22-32 mmol/L _1 BUN Latest Ref Range: 6-20 mg/dL _2 Creatinine Latest Ref Range: 0.44-1.00 mg/dL 0.62 0.54 0.59  Calcium Latest Ref Range: 8.9-10.3 mg/dL 8.9 8.5 (L) 8.3 (L)  EGFR (Non-African Amer.) Latest Ref Range: >60 mL/min >60 >60 >60  EGFR (African American) Latest Ref Range: >60 mL/min >60 >60 >60  Glucose Latest Ref Range: 65-99 mg/dL 171 (H) 117 (H) 115 (H)  Anion  gap Latest Ref Range: 5-_3 Alkaline Phosphatase Latest Ref Range: 38-126 U/L 170 (H) 154 (H) 202 (H)  Albumin Latest Ref Range: 3.5-5.0 g/dL 2.6 (L) 2.3 (L) 2.3 (L)  AST Latest Ref Range: 15-41 U/L 90 (H) 72 (H) 100 (H)  ALT Latest Ref Range: 14-54 U/L 54 53 99 (H)  Total Protein Latest Ref Range: 6.5-8.1 g/dL 6.7 5.8 (L) 5.9 (L)  Total Bilirubin Latest Ref Range: 0.3-1.2 mg/dL 1.2 0.2 (L) 0.3  WBC Latest Ref Range: 3.6-11.0 K/uL 9.2 7.0 6.3  RBC Latest Ref Range: 3.80-5.20 MIL/uL 3.38 (L) 3.02 (L) 3.10 (L)  Hemoglobin Latest Ref Range: 12.0-16.0 g/dL 10.1 (L) 9.0 (L) 9.4 (L)  Platelets Latest Ref Range: 150-440 K/uL 202 165 174    Ref. Range 03/11/2015 06:05 03/12/2015 04:15 03/13/2015 06:53  Glucose Latest Ref Range: 65-99 mg/dL 171 (H) 117 (H) 115 (H)     Imaging  03/12/2015:  EXAM: [place]DOMEN LIMITED - RIGHT UPPER QUADRANT  FINDINGS: Gallbladder:  Surgically absent.  Bile ducts: There is mild intrahepatic biliary duct dilatation. There is dilatation of the common bile duct to up to 15 mm. No mass or calculus is appreciable in the biliary ductal system.  Liver: No focal lesion identified. Within normal limits in parenchymal echogenicity.  IMPRESSION: Gallbladder absent. There is generalized biliary ductal cyst dilatation without mass or calculus demonstrable. Etiology for this degree of biliary ductal dilatation is uncertain. MRCP could be  helpful for further evaluation in this regard. No focal liver lesions are appreciable by ultrasound.   Assessment:  34 y.o. female [redacted]w[redacted]d, with:    1. Dyspnea   2. RUQ pain   3. Elevated liver enzymes   4. History of pre-eclampsia    Plan:   1. Patient discontinued from PCA pump yesterday.  Still with episodic severe RUQ pain.  On PO pain meds.  GI consulted. RUQ sono notes biliary duct cyst dilation, but no mass or stones.  H/o cholelithiasis in the past, s/p cholecystectomy. Could possibly be biliary colic, but will await  consult for further recommendations. 2. Liver enzymes, Hgb and platelets currently stable (previously liver enzymes increasing, and Hgb and platelets decreasing, suggestive of impending HELLP).  Glucose levels stable (so less suggestive of acute fatty liver).  Still awaiting Hepatitis panel results.  Patient denies recent exposure.  BPs still normal.   3. Continue to assess fetal heart tones daily q shift. 4. SCDs for DVT prophylaxis.   Anika Cherry, MD [facility]  

## 2015-03-14 ENCOUNTER — Inpatient Hospital Stay: Payer: BLUE CROSS/BLUE SHIELD

## 2015-03-14 DIAGNOSIS — K838 Other specified diseases of biliary tract: Secondary | ICD-10-CM | POA: Clinically undetermined

## 2015-03-14 DIAGNOSIS — R1011 Right upper quadrant pain: Secondary | ICD-10-CM | POA: Diagnosis present

## 2015-03-14 LAB — COMPREHENSIVE METABOLIC PANEL
ALBUMIN: 2.4 g/dL — AB (ref 3.5–5.0)
ALT: 109 U/L — ABNORMAL HIGH (ref 14–54)
ALT: 120 U/L — ABNORMAL HIGH (ref 14–54)
ANION GAP: 5 (ref 5–15)
ANION GAP: 8 (ref 5–15)
AST: 90 U/L — AB (ref 15–41)
AST: 87 U/L — ABNORMAL HIGH (ref 15–41)
Albumin: 2.8 g/dL — ABNORMAL LOW (ref 3.5–5.0)
Alkaline Phosphatase: 240 U/L — ABNORMAL HIGH (ref 38–126)
Alkaline Phosphatase: 261 U/L — ABNORMAL HIGH (ref 38–126)
BUN: 7 mg/dL (ref 6–20)
BUN: 6 mg/dL (ref 6–20)
CHLORIDE: 105 mmol/L (ref 101–111)
CHLORIDE: 102 mmol/L (ref 101–111)
CO2: 26 mmol/L (ref 22–32)
CO2: 27 mmol/L (ref 22–32)
Calcium: 8.8 mg/dL — ABNORMAL LOW (ref 8.9–10.3)
Calcium: 9 mg/dL (ref 8.9–10.3)
Creatinine, Ser: 0.67 mg/dL (ref 0.44–1.00)
Creatinine, Ser: 0.69 mg/dL (ref 0.44–1.00)
GFR calc Af Amer: >60 mL/min (ref >60)
GFR calc non Af Amer: >60 mL/min (ref >60)
GFR calc non Af Amer: >60 mL/min (ref >60)
GLUCOSE: 65 mg/dL (ref 65–99)
Glucose, Bld: 100 mg/dL — ABNORMAL HIGH (ref 65–99)
POTASSIUM: 4.4 mmol/L (ref 3.5–5.1)
Potassium: 3.5 mmol/L (ref 3.5–5.1)
SODIUM: 136 mmol/L (ref 135–145)
SODIUM: 137 mmol/L (ref 135–145)
TOTAL PROTEIN: 5.7 g/dL — AB (ref 6.5–8.1)
Total Bilirubin: 0.3 mg/dL (ref 0.3–1.2)
Total Bilirubin: 0.2 mg/dL — ABNORMAL LOW (ref 0.3–1.2)
Total Protein: 6.8 g/dL (ref 6.5–8.1)

## 2015-03-14 LAB — PROTIME-INR
INR: 1
INR: 1.04
PROTHROMBIN TIME: 13.4 s (ref 11.4–15.0)
PROTHROMBIN TIME: 13.8 s (ref 11.4–15.0)

## 2015-03-14 LAB — CBC
HCT: 33.9 % — ABNORMAL LOW (ref 35.0–47.0)
HEMATOCRIT: 30.4 % — AB (ref 35.0–47.0)
HEMOGLOBIN: 10 g/dL — AB (ref 12.0–16.0)
Hemoglobin: 11.2 g/dL — ABNORMAL LOW (ref 12.0–16.0)
MCH: 29 pg (ref 26.0–34.0)
MCH: 29.4 pg (ref 26.0–34.0)
MCHC: 32.7 g/dL (ref 32.0–36.0)
MCHC: 32.9 g/dL (ref 32.0–36.0)
MCV: 88.4 fL (ref 80.0–100.0)
MCV: 89.1 fL (ref 80.0–100.0)
PLATELETS: 231 10*3/uL (ref 150–440)
Platelets: 185 10*3/uL (ref 150–440)
RBC: 3.44 MIL/uL — ABNORMAL LOW (ref 3.80–5.20)
RBC: 3.8 MIL/uL (ref 3.80–5.20)
RDW: 14.7 % — AB (ref 11.5–14.5)
RDW: 14.6 % — ABNORMAL HIGH (ref 11.5–14.5)
WBC: 6.6 10*3/uL (ref 3.6–11.0)
WBC: 7.2 10*3/uL (ref 3.6–11.0)

## 2015-03-14 LAB — LACTATE DEHYDROGENASE: LDH: 116 U/L (ref 98–192)

## 2015-03-14 LAB — RETICULOCYTES
RBC.: 3.8 MIL/uL (ref 3.80–5.20)
RETIC COUNT ABSOLUTE: 72.2 10*3/uL (ref 19.0–183.0)
Retic Ct Pct: 1.9 % (ref 0.4–3.1)

## 2015-03-14 LAB — BILIRUBIN, DIRECT: Bilirubin, Direct: <0.1 mg/dL — ABNORMAL LOW (ref 0.1–0.5)

## 2015-03-14 NOTE — Progress Notes (Signed)
GI Note:  Still with RUQ pain. Liver enzymes stable. LIpase nml. INR nml.   Not clear whether the abd pain and elev liver enzymes are related or two separate processes.   Awaiting MRCP resutls given the dialted CBD. ? CBD stone, ? Sphincter of Oddy dysfunction.   Further recs pending MRCP.

## 2015-03-14 NOTE — Progress Notes (Signed)
Antenatal Progress Note  Subjective:     Patient ID: Brenda Leonard is a 34 y.o. G34P2002 female at [redacted]w[redacted]d Estimated Date of Delivery: 05/16/15 by Patient's last menstrual period was 08/09/2014 (approximate). consistent with 1st trimester sono who was admitted for persistent RUQ pain.  HD#5.   Subjective:  Patient denies complaints this morning.  S/p GI consult yesterday, currently NPO for scheduled procedure of MRCP.   Review of Systems Denies contractions, leakage of fluids, vaginal bleeding, and reports good fetal movement. Intermittent RUQ pain with associated SOB.    Objective:   Filed Vitals:   03/13/15 1655 03/13/15 2016 03/14/15 0126 03/14/15 0457  BP: 118/62 1_0  Pulse: 84 81 84 59  Temp: 98.3 F (36.8 C) 98.4 F (36.9 C) 97.8 F (36.6 C) 98.3 F (36.8 C)  TempSrc: Oral Oral Oral Oral  Resp: _1 SpO2:  96% 96% 98%    General appearance: alert and no distress Lungs: clear to auscultation bilaterally Heart: regular rate and rhythm, S1, S2 normal, no murmur, click, rub or gallop Abdomen: soft, non-tender; bowel sounds normal; no masses,  no organomegaly Pelvic: deferred Extremities: extremities normal, atraumatic, no cyanosis or edema   FHT: present.    Labs:  Results for TLEEYAH, HEATHER(MRN [id number] as of 03/13/2015 09:58  Ref. Range 03/11/2015 06:05 03/12/2015 04:15 03/13/2015 06:53  Sodium Latest Ref Range: 135-145 mmol/L 136 138 136  Potassium Latest Ref Range: 3.5-5.1 mmol/L 4.3 3.9 3.4 (L)  Chloride Latest Ref Range: 101-111 mmol/L 106 107 106  CO2 Latest Ref Range: 22-32 mmol/L _2 BUN Latest Ref Range: 6-20 mg/dL _3 Creatinine Latest Ref Range: 0.44-1.00 mg/dL 0.62 0.54 0.59  Calcium Latest Ref Range: 8.9-10.3 mg/dL 8.9 8.5 (L) 8.3 (L)  EGFR (Non-African Amer.) Latest Ref Range: >60 mL/min >60 >60 >60  EGFR (African American) Latest Ref Range: >60 mL/min >60 >60 >60  Glucose Latest Ref Range: 65-99 mg/dL 171 (H)  117 (H) 115 (H)  Anion gap Latest Ref Range: 5-_4 Alkaline Phosphatase Latest Ref Range: 38-126 U/L 170 (H) 154 (H) 202 (H)  Albumin Latest Ref Range: 3.5-5.0 g/dL 2.6 (L) 2.3 (L) 2.3 (L)  AST Latest Ref Range: 15-41 U/L 90 (H) 72 (H) 100 (H)  ALT Latest Ref Range: 14-54 U/L 54 53 99 (H)  Total Protein Latest Ref Range: 6.5-8.1 g/dL 6.7 5.8 (L) 5.9 (L)  Total Bilirubin Latest Ref Range: 0.3-1.2 mg/dL 1.2 0.2 (L) 0.3  WBC Latest Ref Range: 3.6-11.0 K/uL 9.2 7.0 6.3  RBC Latest Ref Range: 3.80-5.20 MIL/uL 3.38 (L) 3.02 (L) 3.10 (L)  Hemoglobin Latest Ref Range: 12.0-16.0 g/dL 10.1 (L) 9.0 (L) 9.4 (L)  Platelets Latest Ref Range: 150-440 K/uL 202 165 174    Ref. Range 03/11/2015 06:05 03/12/2015 04:15 03/13/2015 06:53  Glucose Latest Ref Range: 65-99 mg/dL 171 (H) 117 (H) 115 (H)   Lab Results  Component Value Date   LIPASE 18 03/13/2015     Imaging  03/12/2015:  EXAM: [place]DOMEN LIMITED - RIGHT UPPER QUADRANT  FINDINGS: Gallbladder:  Surgically absent.  Bile ducts: There is mild intrahepatic biliary duct dilatation. There is dilatation of the common bile duct to up to 15 mm. No mass or calculus is appreciable in the biliary ductal system.  Liver: No focal lesion identified. Within normal limits in parenchymal echogenicity.  IMPRESSION: Gallbladder absent. There is generalized biliary ductal cyst dilatation without mass or  calculus demonstrable. Etiology for this degree of biliary ductal dilatation is uncertain. MRCP could be helpful for further evaluation in this regard. No focal liver lesions are appreciable by ultrasound.   Assessment:  34 y.o. female [redacted]w[redacted]d with:    1. Dyspnea   2. RUQ pain   3. Elevated liver enzymes   4. History of pre-eclampsia   5. Intrahepatic bile duct dilation   Plan:   1.S/p GI consult, who recommend ERCP (without contrast) for biliary duct dilation.  Lipase wnl. Has been NPO since midnight.  2. Liver enzymes, Hgb and platelets  currently stable (previously liver enzymes increasing, and Hgb and platelets decreasing, suggestive of impending HELLP, in light of patient's previous h/o pre-eclampsia in prior pregnancy).  Glucose levels stable (so less suggestive of acute fatty liver).  Still awaiting Hepatitis panel results.  Patient denies recent exposure.  BPs still normal.  Pending today's labs.  3. Continue to assess fetal heart tones daily q shift. 4. SCDs for DVT prophylaxis.  5. Pain control with Percocet, Morphine for breakthrough pain.   ARubie Maid MD [facility]

## 2015-03-15 ENCOUNTER — Encounter: Payer: BLUE CROSS/BLUE SHIELD | Admitting: Obstetrics and Gynecology

## 2015-03-15 ENCOUNTER — Inpatient Hospital Stay: Payer: BLUE CROSS/BLUE SHIELD

## 2015-03-15 DIAGNOSIS — R1011 Right upper quadrant pain: Secondary | ICD-10-CM

## 2015-03-15 DIAGNOSIS — R748 Abnormal levels of other serum enzymes: Secondary | ICD-10-CM

## 2015-03-15 LAB — COMPREHENSIVE METABOLIC PANEL
ALBUMIN: 2.5 g/dL — AB (ref 3.5–5.0)
ALT: 102 U/L — ABNORMAL HIGH (ref 14–54)
ANION GAP: 5 (ref 5–15)
AST: 61 U/L — ABNORMAL HIGH (ref 15–41)
Alkaline Phosphatase: 242 U/L — ABNORMAL HIGH (ref 38–126)
BILIRUBIN TOTAL: 0.3 mg/dL (ref 0.3–1.2)
BUN: 8 mg/dL (ref 6–20)
CALCIUM: 8.7 mg/dL — AB (ref 8.9–10.3)
CO2: 24 mmol/L (ref 22–32)
Chloride: 108 mmol/L (ref 101–111)
Creatinine, Ser: 0.65 mg/dL (ref 0.44–1.00)
GFR calc non Af Amer: >60 mL/min (ref >60)
GLUCOSE: 91 mg/dL (ref 65–99)
POTASSIUM: 3.8 mmol/L (ref 3.5–5.1)
SODIUM: 137 mmol/L (ref 135–145)
TOTAL PROTEIN: 6 g/dL — AB (ref 6.5–8.1)

## 2015-03-15 LAB — CBC
HCT: 31.8 % — ABNORMAL LOW (ref 35.0–47.0)
Hemoglobin: 10.4 g/dL — ABNORMAL LOW (ref 12.0–16.0)
MCH: 28.9 pg (ref 26.0–34.0)
MCHC: 32.8 g/dL (ref 32.0–36.0)
MCV: 88.1 fL (ref 80.0–100.0)
Platelets: 198 10*3/uL (ref 150–440)
RBC: 3.61 MIL/uL — ABNORMAL LOW (ref 3.80–5.20)
RDW: 14.5 % (ref 11.5–14.5)
WBC: 6.6 10*3/uL (ref 3.6–11.0)

## 2015-03-15 MED ORDER — OXYCODONE HCL 5 MG PO TABS: 5.0000 mg | ORAL_TABLET | ORAL | Status: DC | PRN

## 2015-03-15 MED ORDER — HYDROXYZINE HCL 50 MG PO TABS
50.0000 mg | ORAL_TABLET | Freq: Three times a day (TID) | ORAL | Status: DC | PRN
  Administered 2015-03-15: 50 mg via ORAL
  Filled 2015-03-15 (×2): qty 1

## 2015-03-15 MED ORDER — OXYCODONE HCL 5 MG PO TABS
10.0000 mg | ORAL_TABLET | ORAL | Status: DC | PRN
  Administered 2015-03-15 – 2015-03-16 (×2): 10 mg via ORAL
  Filled 2015-03-15 (×2): qty 2

## 2015-03-15 MED ORDER — PANTOPRAZOLE SODIUM 40 MG PO TBEC
40.0000 mg | DELAYED_RELEASE_TABLET | Freq: Two times a day (BID) | ORAL | Status: DC
  Administered 2015-03-15 – 2015-03-16 (×2): 40 mg via ORAL
  Filled 2015-03-15 (×2): qty 1

## 2015-03-15 MED ORDER — MEPERIDINE HCL 25 MG/ML IJ SOLN
25.0000 mg | INTRAMUSCULAR | Status: DC | PRN
  Administered 2015-03-15 (×2): 25 mg via INTRAVENOUS
  Filled 2015-03-15 (×2): qty 1

## 2015-03-15 NOTE — Progress Notes (Addendum)
Antenatal Progress Note (Follow Up)  Discussion had with patient (husband present in room for discussion) regarding all findings.  Have been seen by GI and [facility] today.  [facility] in agreement that etiology of RUQ pain and elevated transaminases are not likely due to the pregnancy (not likely acute fatty liver of pregnancy or HELLP/pre-eclampsia).  Have no further recommendations currently.  GI notes that imaging studies continue to show no evidence of biliary stone in setting of common bile duct dilation.  Possibly spasm of sphincter or sphincter valve dysfunction.  Note that ERCP procedure could be done if deemed absolutely necessary however would recommend care at a tertiary care facility. Recommend supportive care with changing from Morphine to Demerol (to decrease possible spasms), switching from Percocet to Oxycodone (eliminating Tylenol due to liver pain and elevated transaminases), initiating Prevacid daily (however [facility] and not readily available, so will initiate Protonix).  Also recommend treatment of anxiety as patient is very afraid that severe symptoms will return if discharged (although has now been more than 24 hrs without severe symptoms). Will initiate Vistaril. If patient continues to remain relatively asymptomatic over the next 24 hours, and liver enzymes continue to trend downward, can discharge home with PO pain meds.     Hildred LaserAnika Montia Haslip, MD [facility]

## 2015-03-15 NOTE — Progress Notes (Addendum)
Paged by nurse who notes that patient's husband is very upset that she has had many labs and tests done, with no significant findings or a diagnosis.  Is requesting that patient be transferred to a tertiary facility.  As patient is stable currently and no procedures would be performed this evening (unless in emergency setting) can initiate transfer in a.m.  Hildred LaserAnika Knute Mazzuca, MD [facility]

## 2015-03-15 NOTE — OB Triage Note (Addendum)
Patient sent from mother baby for daily NST-  Reactive.

## 2015-03-15 NOTE — Progress Notes (Signed)
GI Note:  MRCP shows dilated CBD but no stones and no other etiology for RUQ pain.  Suspect this is chronic dilation, [facility] CBD [facility] and now worse.   Bilirubin has remained normal arguing against any biliary obstruction.  Without etiology on MRCP, would not recommend ERCP given the need for fluoro during this procedure and low likelihood of benefit.  EGD would also be very low yield and would not perform in pregnant patient.    Possible mild liver capsule inflammation causing some degree of discomfort but not c/w HELLP or fatty liver of pregnancy at this point.   This is going to continue to be a very difficult case.  Doubt we are going to find a reversible cause for her pain from GI standpoint.  Recommendations:  - awaiting acute hepatitis panel,  Liver u/s with dopplers.  - Consider transfer to tertiary medical center with expertise in Hepatology related management of pregnant patients.

## 2015-03-15 NOTE — Consult Note (Signed)
Subjective: Patient seen for right upper quadrant abdominal pain and abnormal liver enzymes. Patient seen and examined chart reviewed. She states that pain used to come and go. She has not had an episode of it since yesterday afternoon to last night, none today. She states the discomfort currently is about a 2 or so. He has been taking Percocets around-the-clock with occasional morphine.  Objective: Vital signs in last 24 hours: Temp:  [97.8 F (36.6 C)-98 F (36.7 C)] 98 F (36.7 C) (11/03 1621) Pulse Rate:  [62-91] 91 (11/03 1621) Resp:  [18-20] 18 (11/03 1621) BP: (93-131)/(45-75) 109/70 mmHg (11/03 1621) SpO2:  [96 %-97 %] 96 % (11/03 0800) Blood pressure 109/70, pulse 91, temperature 98 F (36.7 C), temperature source Oral, resp. rate 18, last menstrual period 08/09/2014, SpO2 96 %.   Intake/Output from previous day:    Intake/Output this shift:     General appearance:  Well-appearing anxious 34 year old female distress Resp:  Bilaterally clear to auscultation Cardio:  Regular rate and rhythm without rub or gallop GI:  Gravid, bowel sounds are positive normoactive. She has no rebound. He is tender to palpation along the right costal margin in the region of the mid clavicular line. His extends perhaps a centimeter to both above and below the costal margin. Extremities:  No clubbing cyanosis or edema   Lab Results: Results for orders placed or performed during the hospital encounter of 03/10/15 (from the past 24 hour(s))  Bilirubin, direct     Status: Abnormal   Collection Time: 03/14/15  7:27 PM  Result Value Ref Range   Bilirubin, Direct <0.1 (L) 0.1 - 0.5 mg/dL  CBC     Status: Abnormal   Collection Time: 03/14/15  7:27 PM  Result Value Ref Range   WBC 7.2 3.6 - 11.0 K/uL   RBC 3.80 3.80 - 5.20 MIL/uL   Hemoglobin 11.2 (L) 12.0 - 16.0 g/dL   HCT 78.2 (L) 95.6 - 21.3 %   MCV 89.1 80.0 - 100.0 fL   MCH 29.4 26.0 - 34.0 pg   MCHC 32.9 32.0 - 36.0 g/dL   RDW 08.6 (H)  57.8 - 14.5 %   Platelets 231 150 - 440 K/uL  Comprehensive metabolic panel     Status: Abnormal   Collection Time: 03/14/15  7:27 PM  Result Value Ref Range   Sodium 137 135 - 145 mmol/L   Potassium 3.5 3.5 - 5.1 mmol/L   Chloride 102 101 - 111 mmol/L   CO2 27 22 - 32 mmol/L   Glucose, Bld 100 (H) 65 - 99 mg/dL   BUN 6 6 - 20 mg/dL   Creatinine, Ser 4.69 0.44 - 1.00 mg/dL   Calcium 9.0 8.9 - 62.9 mg/dL   Total Protein 6.8 6.5 - 8.1 g/dL   Albumin 2.8 (L) 3.5 - 5.0 g/dL   AST 87 (H) 15 - 41 U/L   ALT 120 (H) 14 - 54 U/L   Alkaline Phosphatase 261 (H) 38 - 126 U/L   Total Bilirubin 0.2 (L) 0.3 - 1.2 mg/dL   GFR calc non Af Amer >60 >60 mL/min   GFR calc Af Amer >60 >60 mL/min   Anion gap 8 5 - 15  Lactate dehydrogenase     Status: None   Collection Time: 03/14/15  7:27 PM  Result Value Ref Range   LDH 116 98 - 192 U/L  Protime-INR     Status: None   Collection Time: 03/14/15  7:27 PM  Result Value  Ref Range   Prothrombin Time 13.8 11.4 - 15.0 seconds   INR 1.04   Reticulocytes     Status: None   Collection Time: 03/14/15  7:27 PM  Result Value Ref Range   Retic Ct Pct 1.9 0.4 - 3.1 %   RBC. 3.80 3.80 - 5.20 MIL/uL   Retic Count, Manual 72.2 19.0 - 183.0 K/uL  CBC     Status: Abnormal   Collection Time: 03/15/15  5:53 AM  Result Value Ref Range   WBC 6.6 3.6 - 11.0 K/uL   RBC 3.61 (L) 3.80 - 5.20 MIL/uL   Hemoglobin 10.4 (L) 12.0 - 16.0 g/dL   HCT 16.1 (L) 09.6 - 04.5 %   MCV 88.1 80.0 - 100.0 fL   MCH 28.9 26.0 - 34.0 pg   MCHC 32.8 32.0 - 36.0 g/dL   RDW 40.9 81.1 - 91.4 %   Platelets 198 150 - 440 K/uL  Comprehensive metabolic panel     Status: Abnormal   Collection Time: 03/15/15  5:53 AM  Result Value Ref Range   Sodium 137 135 - 145 mmol/L   Potassium 3.8 3.5 - 5.1 mmol/L   Chloride 108 101 - 111 mmol/L   CO2 24 22 - 32 mmol/L   Glucose, Bld 91 65 - 99 mg/dL   BUN 8 6 - 20 mg/dL   Creatinine, Ser 7.82 0.44 - 1.00 mg/dL   Calcium 8.7 (L) 8.9 - 10.3 mg/dL    Total Protein 6.0 (L) 6.5 - 8.1 g/dL   Albumin 2.5 (L) 3.5 - 5.0 g/dL   AST 61 (H) 15 - 41 U/L   ALT 102 (H) 14 - 54 U/L   Alkaline Phosphatase 242 (H) 38 - 126 U/L   Total Bilirubin 0.3 0.3 - 1.2 mg/dL   GFR calc non Af Amer >60 >60 mL/min   GFR calc Af Amer >60 >60 mL/min   Anion gap 5 5 - 15      Recent Labs  03/14/15 0532 03/14/15 1927 03/15/15 0553  WBC 6.6 7.2 6.6  HGB 10.0* 11.2* 10.4*  HCT 30.4* 33.9* 31.8*  PLT 185 231 198   BMET  Recent Labs  03/14/15 0532 03/14/15 1927 03/15/15 0553  NA 136 137 137  K 4.4 3.5 3.8  CL 105 102 108  CO2 GLUCOSE 65 100* 91  BUN CREATININE 0.67 0.69 0.65  CALCIUM 8.8* 9.0 8.7*   LFT  Recent Labs  03/14/15 1927 03/15/15 0553  PROT 6.8 6.0*  ALBUMIN 2.8* 2.5*  AST 87* 61*  ALT 120* 102*  ALKPHOS 261* 242*  BILITOT 0.2* 0.3  BILIDIR <0.1*  --    PT/INR  Recent Labs  03/14/15 0532 03/14/15 1927  LABPROT 13.4 13.8  INR 1.00 1.04   Hepatitis Panel No results for input(s): HEPBSAG, HCVAB, HEPAIGM, HEPBIGM in the last 72 hours. C-Diff No results for input(s): CDIFFTOX in the last 72 hours. No results for input(s): CDIFFPCR in the last 72 hours.   Studies/Results: Mr Abdomen Mrcp Wo Cm  03/14/2015  CLINICAL DATA:  34 year old female at 31 weeks and 0 days with elevated liver enzymes in right upper quadrant pain. EXAM: MRI ABDOMEN WITHOUT CONTRAST  (INCLUDING MRCP) TECHNIQUE: Multiplanar multisequence MR imaging of the abdomen was performed. Heavily T2-weighted images of the biliary and pancreatic ducts were obtained, and three-dimensional MRCP images were rendered by post processing. COMPARISON:  None. FINDINGS: Lower chest:  No pleural or pericardial  fluid identified. Hepatobiliary: No focal liver abnormality identified. Previous cholecystectomy. There is mild intrahepatic bile duct dilatation. The extrahepatic bile duct measure 1.6 cm in maximum diameter. The common bile duct tapers to the level  of the ampulla. No choledocholithiasis identified. Pancreas: Normal appearance of the pancreas. No pancreatic duct dilatation identified. Spleen: Negative. Adrenals/Urinary Tract: The adrenal glands are normal. The kidneys are on remarkable. Stomach/Bowel: The stomach and upper abdominal bowel loops are unremarkable. Vascular/Lymphatic: Normal appearance of the abdominal aorta. No upper abdominal bowel loops identified. Other: No free fluid or fluid collections within the upper abdomen. Gravid uterus noted. Musculoskeletal: Normal signal throughout the bone marrow. IMPRESSION: 1. Increase caliber of the common bile duct with mild intrahepatic duct dilatation status post cholecystectomy. No choledocholithiasis or obstructing mass noted. 2. Gravid uterus. Electronically Signed   By: Signa Kellaylor  Stroud M.D.   On: 03/14/2015 12:55   [place] Art/ven Flow Abd Pelv Doppler  03/15/2015  CLINICAL DATA:  Elevated liver enzymes and right upper quadrant pain. Pregnant patient. EXAM: DUPLEX ULTRASOUND OF LIVER TECHNIQUE: Color and duplex Doppler ultrasound was performed to evaluate the hepatic in-flow and out-flow vessels. COMPARISON:  MRI 03/14/2015 FINDINGS: Portal Vein Velocities Main:  44 cm/sec Right:  15 cm/sec Left:  31 cm/sec Hepatic Vein Velocities Right:  25 cm/sec Middle:  27 cm/sec Left:  26 cm/sec Hepatic Artery Velocity:  115 cm/sec Splenic Vein Velocity:  18 cm/sec Varices: Absent Ascites: Absent Portal vein is patent without thrombus. Normal hepatopetal flow in the portal veins. Normal hepatofugal flow in the hepatic veins. Main portal vein measures up to 1.7 cm with normal waveforms. IVC is patent with normal waveforms. Splenic vein is patent. Normal appearance of the spleen measuring 7.6 x 9.9 x 4.7 cm. IMPRESSION: Normal liver duplex examination. Electronically Signed   By: Richarda OverlieAdam  Henn M.D.   On: 03/15/2015 12:00    Scheduled Inpatient Medications:   . aspirin  81 mg Oral Daily  . docusate sodium  100 mg Oral  Daily  . famotidine  20 mg Oral BID  . prenatal multivitamin  1 tablet Oral Q1200    Continuous Inpatient Infusions:     PRN Inpatient Medications:  acetaminophen, calcium carbonate, diphenhydrAMINE, iohexol, morphine injection, oxyCODONE-acetaminophen, zolpidem  Miscellaneous:   Assessment:  1. Right upper quadrant pain of uncertain etiology. Imaging does show a dilated bile duct and may infer him amount of papillary stenosis, this could possibly count for some colicky-type discomfort however doubt the elevation of LFTs. It is of note that she's got a normal to subnormal bilirubin without overt evidence of obstruction. She is postcholecystectomy which would cause some amount of biliary ductal dilatation however question whether it would be 1.6 cm. She is quite anxious. Etiology for LFTs certain. Patient had no similar symptoms previous to admission. 2. [facility]. Agree that presentation does not seem to fit etiologies such as acute fatty liver pregnancy or HELLP. 3. History of reflux and intermittent heartburn symptoms. No outpatient medications for this.  Plan:  1. Would place her on Prevacid 30 mg once to twice daily. This should alleviate any reflux related issues. 2. Would avoid any acetaminophen use. 3. Consider anxiolytic drug 4. Would change morphine sulfate to meperidine a possible clinically. It is generally felt that morphine may actually increase sphincter of oddi pressure more than meperidine and meperidine may be a better medication for this reason. 5. Daily liver enzymes and pro time. 6. Would consider further evaluation at tertiary  institution where there is a hepatology/biliary service should she not improve. 7. Discussed with Dr. Shonna Chock MD 03/15/2015, 4:28 PM

## 2015-03-15 NOTE — Progress Notes (Signed)
Patient off the floor for imaging study.  Will return later to assess.

## 2015-03-15 NOTE — Consult Note (Signed)
[facility] Consultation   Chief Complaint: RUQ pain with elevated LFTs  HPI: Ms. Brenda Leonard is a 34 y.o. G3P2002 at 69w1dby 7wk [place](uncertain LMP) who presents in consultation from Dr. CMarcelline Matesfor recommendations regarding new onset RUQ pain and elevated LFTs in the setting of 3rd trimester pregnancy.    She states that this episode began on Thursday with 2 instances of RUQ pain that lasted about 1 hour each and woke her from sleep.  She had another on Friday at work and by Saturday they were more frequent and lasting longer.  She was admitted to the hospital Saturday evening.  She states that she doesn't know of any precipitating factors.  Pain is not reproducible (ie unlikely to be musculoskeletal) although it is exacerbated by pressure.  Pain starts under right breast and radiates laterally and ends under her right scapula.  It is associated with painful breathing.  Pain is greater than natural childbirth, gallstones or kidney stones per her recollection.  Currently she describes a low level baseline pain that is well controlled on percocet but still occasionally has more intense pain that requires IV morphine.  Last episode of this was last night.  She denies N/V and although she doesn't have an appetite, she is able to eat and drink without problems.  Has never had this pain before.  Past Medical History: Patient  has a past medical history of Vaginal Pap smear, abnormal (2013).  Past Surgical History: She  has past surgical history that includes Gallbladder surgery (11/2013).  Obstetric History:  OB History    Gravida Para Term Preterm AB TAB SAB Ectopic Multiple Living   '3 2 2       2      ' Medications:  Current facility-administered medications:  .  acetaminophen (TYLENOL) tablet 650 mg, 650 mg, Oral, Q4H PRN, MAlanda SlimDefrancesco, MD .  aspirin chewable tablet 81 mg, 81 mg, Oral, Daily, MAlanda SlimDefrancesco, MD, 81 mg at 03/15/15 1042 .  calcium carbonate (TUMS  - dosed in mg elemental calcium) chewable tablet 400 mg of elemental calcium, 2 tablet, Oral, Q4H PRN, MAlanda SlimDefrancesco, MD, 400 mg of elemental calcium at 03/14/15 1400 .  diphenhydrAMINE (BENADRYL) capsule 25 mg, 25 mg, Oral, Q6H PRN, MBrayton Mars MD, 25 mg at 03/15/15 0039 .  docusate sodium (COLACE) capsule 100 mg, 100 mg, Oral, Daily, MAlanda SlimDefrancesco, MD, 100 mg at 03/15/15 1043 .  famotidine (PEPCID) 40 MG/5ML suspension 20 mg, 20 mg, Oral, BID, MAlanda SlimDefrancesco, MD, 20 mg at 03/15/15 1043 .  iohexol (OMNIPAQUE) 350 MG/ML injection 75 mL, 75 mL, Intravenous, Once PRN, MAlanda SlimDefrancesco, MD .  morphine 2 MG/ML injection 2 mg, 2 mg, Intravenous, Q1H PRN, MBrayton Mars MD, 2 mg at 03/14/15 1332 .  oxyCODONE-acetaminophen (PERCOCET/ROXICET) 5-325 MG per tablet 1-2 tablet, 1-2 tablet, Oral, Q4H PRN, MBrayton Mars MD, 2 tablet at 03/15/15 1043 .  prenatal multivitamin tablet 1 tablet, 1 tablet, Oral, Q1200, MBrayton Mars MD, 1 tablet at 03/15/15 1043 .  zolpidem (AMBIEN) tablet 5 mg, 5 mg, Oral, QHS PRN, MBrayton Mars MD  Allergies: Patient is allergic to iodinated diagnostic agents.  Social History: Patient  reports that she has never smoked. She has never used smokeless tobacco. She reports that she does not drink alcohol or use illicit drugs. 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 Family History: family history  includes Heart disease in her sister.  Review of Systems A full 12 point review of systems was negative or as noted in the History of Present Illness.  Physical Exam: BP 131/75 mmHg  Pulse 90  Temp(Src) 98 F (36.7 C) (Oral)  Resp 20  SpO2 96%  LMP 08/09/2014 (Approximate)  Prior BPs have all been within normal range. Labs:  Results for Brenda, Leonard (MRN [id number]) as of 03/13/2015 09:58  Ref. Range 03/11/2015 06:05 03/12/2015 04:15 03/13/2015 06:53  Sodium Latest Ref Range:  135-145 mmol/L 136 138 136  Potassium Latest Ref Range: 3.5-5.1 mmol/L 4.3 3.9 3.4 (L)  Chloride Latest Ref Range: 101-111 mmol/L 106 107 106  CO2 Latest Ref Range: 22-32 mmol/L '23 25 23  ' BUN Latest Ref Range: 6-20 mg/dL '8 7 6  ' Creatinine Latest Ref Range: 0.44-1.00 mg/dL 0.62 0.54 0.59  Calcium Latest Ref Range: 8.9-10.3 mg/dL 8.9 8.5 (L) 8.3 (L)  EGFR (Non-African Amer.) Latest Ref Range: >60 mL/min >60 >60 >60  EGFR (African American) Latest Ref Range: >60 mL/min >60 >60 >60  Glucose Latest Ref Range: 65-99 mg/dL 171 (H) 117 (H) 115 (H)  Anion gap Latest Ref Range: 5-'15  7 6 7  ' Alkaline Phosphatase Latest Ref Range: 38-126 U/L 170 (H) 154 (H) 202 (H)  Albumin Latest Ref Range: 3.5-5.0 g/dL 2.6 (L) 2.3 (L) 2.3 (L)  AST Latest Ref Range: 15-41 U/L 90 (H) 72 (H) 100 (H)  ALT Latest Ref Range: 14-54 U/L 54 53 99 (H)  Total Protein Latest Ref Range: 6.5-8.1 g/dL 6.7 5.8 (L) 5.9 (L)  Total Bilirubin Latest Ref Range: 0.3-1.2 mg/dL 1.2 0.2 (L) 0.3  WBC Latest Ref Range: 3.6-11.0 K/uL 9.2 7.0 6.3  RBC Latest Ref Range: 3.80-5.20 MIL/uL 3.38 (L) 3.02 (L) 3.10 (L)  Hemoglobin Latest Ref Range: 12.0-16.0 g/dL 10.1 (L) 9.0 (L) 9.4 (L)  Platelets Latest Ref Range: 150-440 K/uL 202 165 174    Ref. Range 03/11/2015 06:05 03/12/2015 04:15 03/13/2015 06:53  Glucose Latest Ref Range: 65-99 mg/dL 171 (H) 117 (H) 115 (H)    Recent Labs    Lab Results  Component Value Date   LIPASE 18 03/13/2015         03/15/15:  AST 61, ALT 102, Plts 242  US ABDOMEN LIMITED - RIGHT UPPER QUADRANT  IMPRESSION: Gallbladder absent. There is generalized biliary ductal cyst dilatation without mass or calculus demonstrable. Etiology for this degree of biliary ductal dilatation is uncertain. MRCP could be helpful for further evaluation in this regard. No focal liver lesions are appreciable  by ultrasound.  MRI ABDOMEN WITHOUT CONTRAST (INCLUDING MRCP) IMPRESSION: 1. Increase caliber of the common bile duct with mild intrahepatic duct dilatation status post cholecystectomy. No choledocholithiasis or obstructing mass noted. 2. Gravid uterus.  DUPLEX ULTRASOUND OF LIVER  IMPRESSION: Normal liver duplex examination  Asessement: 1. Dyspnea   2. RUQ pain   3. Elevated liver enzymes   4. History of pre-eclampsia     Plan: 34yo G3P2 at 68w1dwith RUQ pain that is requiring narcotics for pain control, initial elevation in LFTs, in the absence of proteinuria or elevated BPs. 1.  Agree with GI consult.  Ms. TCendejashas had imaging to r/o PE, had a normal RUQ [place] for bile duct dilation and had an MRCP which also demonstrated bile duct dilation.  Liver Doppler was normal.  Query if she had a bile duct stone or obstruction which contributed to pain and initial rise in LFTs.  Recommend continuing  to trend labs and f/up with GI recommendations. 2.  Do not suspect preeclampsia.  Based on lack of maternal symptoms (other than RUQ pain which may have an anatomical component to it (uric acid, p/c ratio and plts remain normal and BPs are normal as well) but would continue to keep this in the differential until more definite diagnosis is made. 3.  Recommend SCDs while in bed (patient states she has not been wearing them). 4.  Follow up hepatitis labs - low suspicion given LFTs are not that elevated and there are no known risk factors (per patient) nor associated findings.  Total time spent with the patient was 30 minutes with greater than 50% spent in counseling and coordination of care. We appreciate this interesting consult and will be happy to be involved in the ongoing care of Ms. Diver in anyway her obstetricians desire.  Wynona Neat, MD [facility]

## 2015-03-15 NOTE — Progress Notes (Signed)
Antenatal Progress Note  Subjective:     Patient ID: Brenda Leonard is a 34 y.o. G19P2002 female at [redacted]w[redacted]d Estimated Date of Delivery: 05/16/15 by Patient's last menstrual period was 08/09/2014 (approximate). consistent with 1st trimester sono who was admitted for persistent RUQ pain.  HD#6.   Subjective:  Patient denies major complaints.  Denies recent episodes of severe RUQ pain, but still notes dull mild pain.Recently completed Duplex [place] this morning. [facility]   Review of Systems Denies contractions, leakage of fluids, vaginal bleeding, and reports good fetal movement.  C/o dry non-productive cough.    Objective:   Filed Vitals:   03/14/15 1857 03/15/15 0131 03/15/15 0441 03/15/15 0800  BP: 106/62 98/45 105/54 93/52  Pulse: 79 64 62 78  Temp: 97.8 F (36.6 C)  98 F (36.7 C) 97.9 F (36.6 C)  TempSrc: Oral  Oral Oral  Resp: _0 SpO2:  97%  96%    General appearance: alert and no distress Lungs: clear to auscultation bilaterally Heart: regular rate and rhythm, S1, S2 normal, no murmur, click, rub or gallop Abdomen: soft, non-tender; bowel sounds normal; no masses,  no organomegaly Pelvic: deferred Extremities: extremities normal, atraumatic, no cyanosis or edema   FHT: present.  NST overnight reactive.   Labs:   Results for TBILAN, TEDESCO(MRN [id number] as of 03/15/2015 18:59  Ref. Range 03/14/2015 05:32 03/14/2015 19:27 03/15/2015 05:53  Sodium Latest Ref Range: 135-145 mmol/L 136 137 137  Potassium Latest Ref Range: 3.5-5.1 mmol/L 4.4 3.5 3.8  Chloride Latest Ref Range: 101-111 mmol/L 105 102 108  CO2 Latest Ref Range: 22-32 mmol/L _1 BUN Latest Ref Range: 6-20 mg/dL _2 Creatinine Latest Ref Range: 0.44-1.00 mg/dL 0.67 0.69 0.65  Calcium Latest Ref Range: 8.9-10.3 mg/dL 8.8 (L) 9.0 8.7 (L)  EGFR (Non-African Amer.) Latest Ref Range: >60 mL/min >60 >60 >60  EGFR (African American) Latest Ref Range:  >60 mL/min >60 >60 >60  Glucose Latest Ref Range: 65-99 mg/dL 65 100 (H) 91  Anion gap Latest Ref Range: 5-_3 Alkaline Phosphatase Latest Ref Range: 38-126 U/L 240 (H) 261 (H) 242 (H)  Albumin Latest Ref Range: 3.5-5.0 g/dL 2.4 (L) 2.8 (L) 2.5 (L)  AST Latest Ref Range: 15-41 U/L 90 (H) 87 (H) 61 (H)  ALT Latest Ref Range: 14-54 U/L 109 (H) 120 (H) 102 (H)  Total Protein Latest Ref Range: 6.5-8.1 g/dL 5.7 (L) 6.8 6.0 (L)  Bilirubin, Direct Latest Ref Range: 0.1-0.5 mg/dL  <0.1 (L)   Total Bilirubin Latest Ref Range: 0.3-1.2 mg/dL 0.3 0.2 (L) 0.3  LDH Latest Ref Range: 98-192 U/L  116   WBC Latest Ref Range: 3.6-11.0 K/uL 6.6 7.2 6.6  RBC Latest Ref Range: 3.80-5.20 MIL/uL 3.44 (L) 3.80 3.61 (L)  Hemoglobin Latest Ref Range: 12.0-16.0 g/dL 10.0 (L) 11.2 (L) 10.4 (L)  HCT Latest Ref Range: 35.0-47.0 % 30.4 (L) 33.9 (L) 31.8 (L)  MCV Latest Ref Range: 80.0-100.0 fL 88.4 89.1 88.1  MCH Latest Ref Range: 26.0-34.0 pg 29.0 29.4 28.9  MCHC Latest Ref Range: 32.0-36.0 g/dL 32.7 32.9 32.8  RDW Latest Ref Range: 11.5-14.5 % 14.7 (H) 14.6 (H) 14.5  Platelets Latest Ref Range: 150-440 K/uL 185 231 198  RBC. Latest Ref Range: 3.80-5.20 MIL/uL  3.80   Retic Ct Pct Latest Ref Range: 0.4-3.1 %  1.9   Retic Count, Manual Latest Ref  Range: 19.0-183.0 K/uL  72.2   Prothrombin Time Latest Ref Range: 11.4-15.0 seconds 13.4 13.8   INR Unknown 1.00 1.04    Imaging   MRCP  03/14/2015:  IMPRESSION: 1. Increase caliber of the common bile duct (1.6 cm) with mild intrahepatic duct dilatation status post cholecystectomy. No choledocholithiasis or obstructing mass noted. 2. Gravid uterus.   US Duplex LIver 03/15/2015:  Portal vein is patent without thrombus. Normal hepatopetal flow in the portal veins. Normal hepatofugal flow in the hepatic veins. Main portal vein measures up to 1.7 cm with normal waveforms. IVC is patent with normal waveforms. Splenic vein is patent. Normal appearance of the spleen  measuring 7.6 x 9.9 x 4.7 cm.  IMPRESSION: Normal liver duplex examination.   Assessment:  34 y.o. female [redacted]w[redacted]d with:    1. Dyspnea   2. RUQ pain   3. Elevated liver enzymes   4. History of pre-eclampsia   5. Intrahepatic bile duct dilation   Plan:   1.S/p GI consult, with normal imaging studies with exception of dilated bile duct.  No evidence of stones or clots. Labs with liver enzymes trending down again, LDH normal.  Will await any further recommendations based on recent labs/imaging completed today.  2 Still awaiting Hepatitis panel results.  Patient denies recent exposure.   3. S/p [facility] consult, awaiting any further recommendations.  3. Continue to assess fetal heart tones daily q shift. 4. SCDs for DVT prophylaxis.  5. Pain control with Percocet, Morphine for breakthrough pain.   Will continue to monitor.  Patient and family expressed concern overnight regarding no true diagnosis with continued symptoms. Are inquiring if transferring to another facility (tertiary care) is warranted.   Discussion had that once I have discussed with all consulting parties, we can make a decision as to what the next step would be.  If transfer is necessary, can arrange.   ARubie Maid MD [facility]

## 2015-03-16 ENCOUNTER — Encounter: Payer: Self-pay | Admitting: Obstetrics and Gynecology

## 2015-03-16 LAB — CBC
HCT: 32.2 % — ABNORMAL LOW (ref 35.0–47.0)
HEMOGLOBIN: 10.7 g/dL — AB (ref 12.0–16.0)
MCH: 29.2 pg (ref 26.0–34.0)
MCHC: 33.1 g/dL (ref 32.0–36.0)
MCV: 88.4 fL (ref 80.0–100.0)
Platelets: 205 10*3/uL (ref 150–440)
RBC: 3.64 MIL/uL — AB (ref 3.80–5.20)
RDW: 14.3 % (ref 11.5–14.5)
WBC: 8.2 10*3/uL (ref 3.6–11.0)

## 2015-03-16 LAB — COMPREHENSIVE METABOLIC PANEL
ALK PHOS: 331 U/L — AB (ref 38–126)
ALT: 126 U/L — AB (ref 14–54)
ANION GAP: 8 (ref 5–15)
AST: 123 U/L — ABNORMAL HIGH (ref 15–41)
Albumin: 2.7 g/dL — ABNORMAL LOW (ref 3.5–5.0)
BILIRUBIN TOTAL: 0.5 mg/dL (ref 0.3–1.2)
BUN: 8 mg/dL (ref 6–20)
CALCIUM: 8.7 mg/dL — AB (ref 8.9–10.3)
CO2: 21 mmol/L — AB (ref 22–32)
CREATININE: 0.59 mg/dL (ref 0.44–1.00)
Chloride: 107 mmol/L (ref 101–111)
Glucose, Bld: 102 mg/dL — ABNORMAL HIGH (ref 65–99)
Potassium: 3.6 mmol/L (ref 3.5–5.1)
SODIUM: 136 mmol/L (ref 135–145)
TOTAL PROTEIN: 6.4 g/dL — AB (ref 6.5–8.1)

## 2015-03-16 LAB — HAPTOGLOBIN: Haptoglobin: 137 mg/dL (ref 34–200)

## 2015-03-16 NOTE — Progress Notes (Signed)
GI Note:  Liver enzymes worse today.  Liver u/s with dopplers normal.    Agree with transfer to tertiary center with hepatologist.   Still awaiting on acute hepaittis panel.  Also need to check asma, ana, ama to r/o auto-immune hepatitis if she is going to be staying, otherwise can be checked at accepting medical center.

## 2015-03-16 NOTE — Progress Notes (Signed)
[facility] transport picked up patient in stable condition. Report given to [place]. [facility].

## 2015-03-16 NOTE — Plan of Care (Signed)
Physician Transfer of Care Summary   Patient ID: Brenda Leonard [id number] 34 y.o. Sep 26, 1980  Admit date: 03/10/2015  Transfer date and time: 03/16/2015 11:22 AM  Admitting Physician: Brayton Mars, MD   Discharge Physician: Rubie Maid, MD  Admission Diagnoses: 32 WEEKS CHEST PAIN , h/o pre-eclampsia in prior pregnancy  Discharge Diagnoses: [redacted] weeks pregnant, persistent elevated liver transaminases, RUQ pain, dilated bilary duct, h/o pre-eclampsia in prior pregnancy.   Admission Condition: poor  Discharged Condition: fair  Indication for Admission: Severe RUQ pain, radiating to back, shortness of breath  Hospital Course: The patient was seen and admitted to the hospital for acute dyspnea with right sided pleuritic chest pain.  Workup at that time included a RUQ ultrasound (noting bilary ductal cyst dilation without mass or calculus) and CT Chest to r/o PE (negative).  Patient was treated with IVF, Morphine for pain.  On HD#2 liver enzymes were noted to be trending upward.  Hepatitis panel, and PIH labs ordered.  On HD#3 pain was beginning to improve, placed on PO meds, but liver enzymes continued to slowly trend upward. On HD#4, pain returned, liver enzymes elevated, consult was placed to Gastroenterology.  Patient underwent MRCP on HD#5 and Dopplers of RUQ on HD#6.  Maternal Fetal Medicine was also consulted on HD#6.  By this time, liver enzymes had begun trending downward and patient's pain had begun to subside.  On HD#7, liver enzymes began trending upward again and pain returned.  Patient requested transfer to [facility].   Consults: GI and Maternal Fetal Medicine  Significant Diagnostic Studies: labs: CBC, CMP, Hepatitis panel, and radiology: MRI: MRCP, CT scan: Chest and Ultrasound: RUQ limitied, and Dopplers Results for GIFT, RUECKERT (MRN [id number]) as of 03/16/2015 10:19  Ref. Range 03/14/2015 05:32 03/14/2015 19:27 03/15/2015 05:53 03/16/2015 05:45  Sodium Latest  Ref Range: 135-145 mmol/L 136 137 137 136  Potassium Latest Ref Range: 3.5-5.1 mmol/L 4.4 3.5 3.8 3.6  Chloride Latest Ref Range: 101-111 mmol/L 105 102 108 107  CO2 Latest Ref Range: 22-32 mmol/L '26 27 24 21 ' (L)  BUN Latest Ref Range: 6-20 mg/dL '7 6 8 8  ' Creatinine Latest Ref Range: 0.44-1.00 mg/dL 0.67 0.69 0.65 0.59  Calcium Latest Ref Range: 8.9-10.3 mg/dL 8.8 (L) 9.0 8.7 (L) 8.7 (L)  EGFR (Non-African Amer.) Latest Ref Range: >60 mL/min >60 >60 >60 >60  EGFR (African American) Latest Ref Range: >60 mL/min >60 >60 >60 >60  Glucose Latest Ref Range: 65-99 mg/dL 65 100 (H) 91 102 (H)  Anion gap Latest Ref Range: 5-'15  5 8 5 8  ' Alkaline Phosphatase Latest Ref Range: 38-126 U/L 240 (H) 261 (H) 242 (H) 331 (H)  Albumin Latest Ref Range: 3.5-5.0 g/dL 2.4 (L) 2.8 (L) 2.5 (L) 2.7 (L)  AST Latest Ref Range: 15-41 U/L 90 (H) 87 (H) 61 (H) 123 (H)  ALT Latest Ref Range: 14-54 U/L 109 (H) 120 (H) 102 (H) 126 (H)  Total Protein Latest Ref Range: 6.5-8.1 g/dL 5.7 (L) 6.8 6.0 (L) 6.4 (L)  Bilirubin, Direct Latest Ref Range: 0.1-0.5 mg/dL  <0.1 (L)    Total Bilirubin Latest Ref Range: 0.3-1.2 mg/dL 0.3 0.2 (L) 0.3 0.5  LDH Latest Ref Range: 98-192 U/L  116    WBC Latest Ref Range: 3.6-11.0 K/uL 6.6 7.2 6.6 8.2  RBC Latest Ref Range: 3.80-5.20 MIL/uL 3.44 (L) 3.80 3.61 (L) 3.64 (L)  Hemoglobin Latest Ref Range: 12.0-16.0 g/dL 10.0 (L) 11.2 (L) 10.4 (L) 10.7 (L)  HCT Latest Ref  Range: 35.0-47.0 % 30.4 (L) 33.9 (L) 31.8 (L) 32.2 (L)  MCV Latest Ref Range: 80.0-100.0 fL 88.4 89.1 88.1 88.4  MCH Latest Ref Range: 26.0-34.0 pg 29.0 29.4 28.9 29.2  MCHC Latest Ref Range: 32.0-36.0 g/dL 32.7 32.9 32.8 33.1  RDW Latest Ref Range: 11.5-14.5 % 14.7 (H) 14.6 (H) 14.5 14.3  Platelets Latest Ref Range: 150-440 K/uL 185 231 198 205        Imaging   CT CHEST 03/11/2015 IMPRESSION: No demonstrable pulmonary embolus. No edema or consolidation. There is mild bibasilar lung atelectasis. No adenopathy. There is hepatic steatosis.  US ABDOMEN LIMITED - RIGHT UPPER QUADRANT 03/12/2015 FINDINGS: Gallbladder: Surgically absent. Bile ducts: There is mild intrahepatic biliary duct dilatation. There is dilatation of the common bile duct to up to 15 mm. No mass or calculus is appreciable in the biliary ductal system. Liver: No focal lesion identified. Within normal limits in parenchymal echogenicity.  IMPRESSION: Gallbladder absent. There is generalized biliary ductal cyst dilatation without mass or calculus demonstrable. Etiology for this degree of biliary ductal dilatation is uncertain. MRCP could be helpful for further evaluation in this regard. No focal liver lesions are appreciable by ultrasound.  MRCP 03/14/2015:  IMPRESSION: 1. Increase caliber of the common bile duct (1.6 cm) with mild intrahepatic duct dilatation status post cholecystectomy. No choledocholithiasis or obstructing mass noted. 2. Gravid uterus.   US Duplex LIver 03/15/2015:  Portal vein is patent without thrombus. Normal hepatopetal flow in the portal veins. Normal hepatofugal flow in the hepatic veins. Main portal vein measures up to 1.7 cm with normal waveforms. IVC is patent with normal waveforms. Splenic vein is patent. Normal appearance of the spleen measuring 7.6 x 9.9 x 4.7 cm.  IMPRESSION: Normal liver duplex examination.   Treatments: IV hydration, analgesia: Morphine and Demerol and Percocet, steroids: solu-medrol   Discharge Exam: Filed Vitals:   03/15/15 2253 03/15/15 2320 03/16/15 0507 03/16/15 0832  BP: 95/57 103/67 93/45 103/57  Pulse: 84  80 78  Temp: 98.2 F (36.8 C)  97.9 F (36.6 C) 98.5 F (36.9 C)  TempSrc: Oral  Oral Oral  Resp: '21  18 20  ' SpO2: 100%  93%    General appearance: alert and no distress Lungs: clear to  auscultation bilaterally Heart: regular rate and rhythm, S1, S2 normal, no murmur, click, rub or gallop Abdomen: soft, non-tender; bowel sounds normal; no masses, no organomegaly Pelvic: deferred Extremities: extremities normal, atraumatic, no cyanosis or edema  FHT: present.    Scheduled Meds: . aspirin  81 mg Oral Daily  . docusate sodium  100 mg Oral Daily  . pantoprazole  40 mg Oral BID  . prenatal multivitamin  1 tablet Oral Q1200   Continuous Infusions: None  PRN Meds:.acetaminophen, diphenhydrAMINE, hydrOXYzine, meperidine (DEMEROL) injection, oxyCODONE, oxyCODONE, zolpidem   Disposition: stable for transfer.   Accepted by Dr. Raphael Gibney at [facility] (Labor and Delivery Attending). Arranging transport.     SignedRubie Maid 03/16/2015 11:08 AM

## 2015-03-16 NOTE — Progress Notes (Signed)
Spoke with Dr. Valentino Saxonherry concerning patient transfer.  [facility] and given verbal report and transport said they will call to give as an ETA for transfer.  Patient and family updated on transfer information with questions answered.

## 2015-03-16 NOTE — Progress Notes (Signed)
Antenatal Progress Note  Subjective:     Patient ID: Brenda Leonard is a 34 y.o. [id number] female at [redacted]w[redacted]d Estimated Date of Delivery: 05/16/15 by Patient's last menstrual period was 08/09/2014 (approximate). consistent with 1st trimester sono who was admitted for persistent colicky RUQ pain.  HD#7.   Subjective:  Patient reports 2 episodes of moderate RUQ overnight. Also notes that she had episode of severe flushing last night after receiving a medication (cannot recall which medication was given). Continues to deny nausea/vomiting, fevers, chills.  Review of Systems Denies contractions, leakage of fluids, vaginal bleeding, and reports good fetal movement.   Objective:   Filed Vitals:   03/15/15 2253 03/15/15 2320 03/16/15 0507 03/16/15 0832  BP: 95/57 103/67 93/45 103/57  Pulse: 84  80 78  Temp: 98.2 F (36.8 C)  97.9 F (36.6 C) 98.5 F (36.9 C)  TempSrc: Oral  Oral Oral  Resp: '21  18 20  ' SpO2: 100%  93%     General appearance: alert and no distress Lungs: clear to auscultation bilaterally Heart: regular rate and rhythm, S1, S2 normal, no murmur, click, rub or gallop Abdomen: soft, non-tender; bowel sounds normal; no masses,  no organomegaly Pelvic: deferred Extremities: extremities normal, atraumatic, no cyanosis or edema  FHT: present.   Labs:   Results for TALLYSSIA, SKLUZACEK(MRN [id number] as of 03/16/2015 10:19  Ref. Range 03/14/2015 05:32 03/14/2015 19:27 03/15/2015 05:53 03/16/2015 05:45  Sodium Latest Ref Range: 135-145 mmol/L 136 137 137 136  Potassium Latest Ref Range: 3.5-5.1 mmol/L 4.4 3.5 3.8 3.6  Chloride Latest Ref Range: 101-111 mmol/L 105 102 108 107  CO2 Latest Ref Range: 22-32 mmol/L '26 27 24 21 ' (L)  BUN Latest Ref Range: 6-20 mg/dL '7 6 8 8  ' Creatinine Latest Ref Range: 0.44-1.00 mg/dL 0.67 0.69 0.65 0.59  Calcium Latest Ref Range: 8.9-10.3 mg/dL 8.8 (L) 9.0 8.7 (L) 8.7 (L)  EGFR (Non-African Amer.) Latest Ref Range: >60 mL/min >60 >60 >60 >60  EGFR  (African American) Latest Ref Range: >60 mL/min >60 >60 >60 >60  Glucose Latest Ref Range: 65-99 mg/dL 65 100 (H) 91 102 (H)  Anion gap Latest Ref Range: 5-'15  5 8 5 8  ' Alkaline Phosphatase Latest Ref Range: 38-126 U/L 240 (H) 261 (H) 242 (H) 331 (H)  Albumin Latest Ref Range: 3.5-5.0 g/dL 2.4 (L) 2.8 (L) 2.5 (L) 2.7 (L)  AST Latest Ref Range: 15-41 U/L 90 (H) 87 (H) 61 (H) 123 (H)  ALT Latest Ref Range: 14-54 U/L 109 (H) 120 (H) 102 (H) 126 (H)  Total Protein Latest Ref Range: 6.5-8.1 g/dL 5.7 (L) 6.8 6.0 (L) 6.4 (L)  Bilirubin, Direct Latest Ref Range: 0.1-0.5 mg/dL  <0.1 (L)    Total Bilirubin Latest Ref Range: 0.3-1.2 mg/dL 0.3 0.2 (L) 0.3 0.5  LDH Latest Ref Range: 98-192 U/L  116    WBC Latest Ref Range: 3.6-11.0 K/uL 6.6 7.2 6.6 8.2  RBC Latest Ref Range: 3.80-5.20 MIL/uL 3.44 (L) 3.80 3.61 (L) 3.64 (L)  Hemoglobin Latest Ref Range: 12.0-16.0 g/dL 10.0 (L) 11.2 (L) 10.4 (L) 10.7 (L)  HCT Latest Ref Range: 35.0-47.0 % 30.4 (L) 33.9 (L) 31.8 (L) 32.2 (L)  MCV Latest Ref Range: 80.0-100.0 fL 88.4 89.1 88.1 88.4  MCH Latest Ref Range: 26.0-34.0 pg 29.0 29.4 28.9 29.2  MCHC Latest Ref Range: 32.0-36.0 g/dL 32.7 32.9 32.8 33.1  RDW Latest Ref Range: 11.5-14.5 % 14.7 (H) 14.6 (H) 14.5 14.3  Platelets Latest Ref Range: 150-440 K/uL  185 231 198 205     Imaging   CT CHEST   03/11/2015 IMPRESSION: No demonstrable pulmonary embolus. No edema or consolidation. There is mild bibasilar lung atelectasis. No adenopathy. There is hepatic steatosis.   US ABDOMEN LIMITED - RIGHT UPPER QUADRANT   03/12/2015 FINDINGS: Gallbladder: Surgically absent. Bile ducts: There is mild intrahepatic biliary duct dilatation. There is dilatation of the common bile duct to up to 15 mm. No mass or calculus is appreciable in the biliary ductal system. Liver: No focal lesion identified. Within normal limits in parenchymal echogenicity.  IMPRESSION: Gallbladder absent. There is generalized biliary ductal  cyst dilatation without mass or calculus demonstrable. Etiology for this degree of biliary ductal dilatation is uncertain. MRCP could be helpful for further evaluation in this regard. No focal liver lesions are appreciable by ultrasound.  MRCP  03/14/2015:  IMPRESSION: 1. Increase caliber of the common bile duct (1.6 cm) with mild intrahepatic duct dilatation status post cholecystectomy. No choledocholithiasis or obstructing mass noted. 2. Gravid uterus.   US Duplex LIver 03/15/2015:  Portal vein is patent without thrombus. Normal hepatopetal flow in the portal veins. Normal hepatofugal flow in the hepatic veins. Main portal vein measures up to 1.7 cm with normal waveforms. IVC is patent with normal waveforms. Splenic vein is patent. Normal appearance of the spleen measuring 7.6 x 9.9 x 4.7 cm.  IMPRESSION: Normal liver duplex examination.   Assessment:  34 y.o. female [redacted]w[redacted]d with:    1. Dyspnea   2. RUQ pain   3. Elevated liver enzymes   4. History of pre-eclampsia   5. Intrahepatic bile duct dilation   Plan:   1.S/p GI consult, with normal imaging studies with exception of dilated bile duct.  No evidence of stones or clots. Labs with liver enzymes trending up again now.  Previous recommendations implemented with no improvement in symptoms thus far (see prior f/u evening note from 03/15/15). labs/imaging completed today.  2 Still awaiting Hepatitis panel results. Contacted lab who noted not enough blood to run specimen.  Reordered stat.  Patient denies recent exposure.   3. S/p [facility] consult, agree that HELLP syndrome and fatty liver of pregnancy not likely diagnosis.   [facility] to concern regarding no true diagnosis with continued symptoms. I have discussed with all consulting parties, and are ok and agree with transfer.  Will initiate transfer.  Discussed case with Dr. SRaphael Gibney(L&D attending), who is willing to  accept transfer. [facility] on [facility], and will manage from there.    ARubie Maid MD [facility]

## 2015-03-16 NOTE — Progress Notes (Signed)
GI Inpatient Follow-up Note  Patient Identification: Brenda Leonard is a 34 y.o. female [redacted]weeks pregnant with RUQ pain, transaminitis.dilated CBD  Interval:   Another pain episode last night. RUQ.  No f/c. No n/v.   Liver u/s with dopplers negative.  Liver enzymes worse today.   Scheduled Inpatient Medications:  . aspirin  81 mg Oral Daily  . docusate sodium  100 mg Oral Daily  . pantoprazole  40 mg Oral BID  . prenatal multivitamin  1 tablet Oral Q1200    Continuous Inpatient Infusions:     PRN Inpatient Medications:  acetaminophen, diphenhydrAMINE, hydrOXYzine, meperidine (DEMEROL) injection, oxyCODONE, oxyCODONE, zolpidem    Physical Examination: BP 114/55 mmHg  Pulse 86  Temp(Src) 98.9 F (37.2 C) (Oral)  Resp 18  SpO2 93%  LMP 08/09/2014 (Approximate) Gen: NAD, alert and oriented x 4 HEENT: PEERLA, EOMI, Neck: supple, no JVD or thyromegaly Chest: CTA bilaterally, no wheezes, crackles, or other adventitious sounds CV: RRR, no m/g/c/r Abd: +gravid, mild RUQ TTP, no r/g,  Ext: no edema, well perfused with 2+ pulses, Skin: no rash or lesions noted Lymph: no LAD  Data: Lab Results  Component Value Date   WBC 8.2 03/16/2015   HGB 10.7* 03/16/2015   HCT 32.2* 03/16/2015   MCV 88.4 03/16/2015   PLT 205 03/16/2015    Recent Labs Lab 03/14/15 1927 03/15/15 0553 03/16/15 0545  HGB 11.2* 10.4* 10.7*   Lab Results  Component Value Date   NA 136 03/16/2015   K 3.6 03/16/2015   CL 107 03/16/2015   CO2 21* 03/16/2015   BUN 8 03/16/2015   CREATININE 0.59 03/16/2015   Lab Results  Component Value Date   ALT 126* 03/16/2015   AST 123* 03/16/2015   ALKPHOS 331* 03/16/2015   BILITOT 0.5 03/16/2015    Recent Labs Lab 03/14/15 1927  INR 1.04   Assessment/Plan: Ms. Brenda Leonard is a 34 y.o. female [redacted]weeks pregnant with RUQ pain, transaminitis, dilated CBD.   MCRP no source for CBD dil.  ? SOD but doubt this explains the transaminitis with normal bilirubin.   Liver u/s with dopples neg.  Acute hep panel still pending.  AST, ALT, alkp slightly worse today.   ? Early AFLP or HELLP.   Recommendations: - asma, ana, if not getting transferred in next few hours ( not ordered on initial serology), otherwise can be done at accepting facility.  - agree with transfer to tertiary center for hepatology/biliary expertise in pregnant patients.    Please call with questions or concerns.  REIN, Addison NaegeliMATTHEW GORDON, MD

## 2015-03-17 LAB — HEPATITIS PANEL, ACUTE
HEP A IGM: NEGATIVE
HEP B S AG: NEGATIVE
Hep B C IgM: NEGATIVE

## 2015-03-19 ENCOUNTER — Encounter: Payer: Self-pay | Admitting: Obstetrics and Gynecology

## 2015-03-19 LAB — HEPATITIS PANEL, ACUTE
HCV AB: 0.1 {s_co_ratio} (ref 0.0–0.9)
HEP A IGM: NEGATIVE
HEP B S AG: NEGATIVE
Hep B C IgM: NEGATIVE

## 2015-03-19 LAB — EPSTEIN-BARR VIRUS VCA, IGG: EBV VCA IgG: 289 U/mL — ABNORMAL HIGH (ref 0.0–17.9)

## 2015-03-19 LAB — RSV(RESPIRATORY SYNCYTIAL VIRUS) AB, BLOOD

## 2015-03-19 LAB — EPSTEIN-BARR VIRUS VCA, IGM: EBV VCA IgM: <36 U/mL (ref 0.0–35.9)

## 2015-03-19 LAB — CMV IGM: CMV IgM: <30 AU/mL (ref 0.0–29.9)

## 2015-03-22 LAB — PATHOLOGIST SMEAR REVIEW

## 2015-03-27 ENCOUNTER — Telehealth: Payer: Self-pay

## 2015-03-27 NOTE — Telephone Encounter (Signed)
Called pt to inquire about her health after being discharged from the hospital. LM for pt to call back and set up next OB appt as last one was cancelled due to patient being in hospital.

## 2015-03-27 NOTE — Telephone Encounter (Signed)
-----   Message from Hildred LaserAnika Cherry, MD sent at 03/27/2015  8:27 AM EST ----- Regarding: check up on patient and needs appointment Please call and check up on patient ([facility] and biliary duct stone). Needs new appointment for routine OB f/u.

## 2015-06-30 IMAGING — US ABDOMEN ULTRASOUND LIMITED
1 series · 14 of 25 positions shown · non-contrast
Comparison: None.

CLINICAL DATA: Right upper quadrant abdominal pain and epigastric
pain for 4-5 hours.

EXAM:
US ABDOMEN LIMITED - RIGHT UPPER QUADRANT

[Series 1: abdomen ultrasound limited · 0.24mm/px · 14 of 42 slices shown]
[im 1/42]
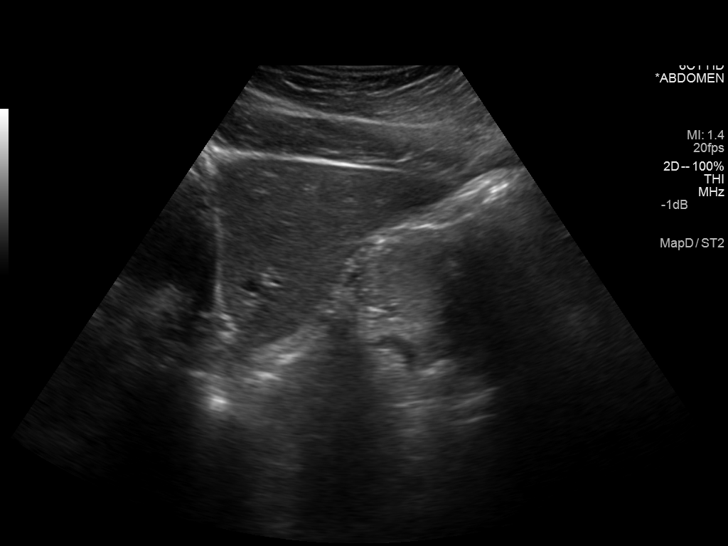
[im 4/42]
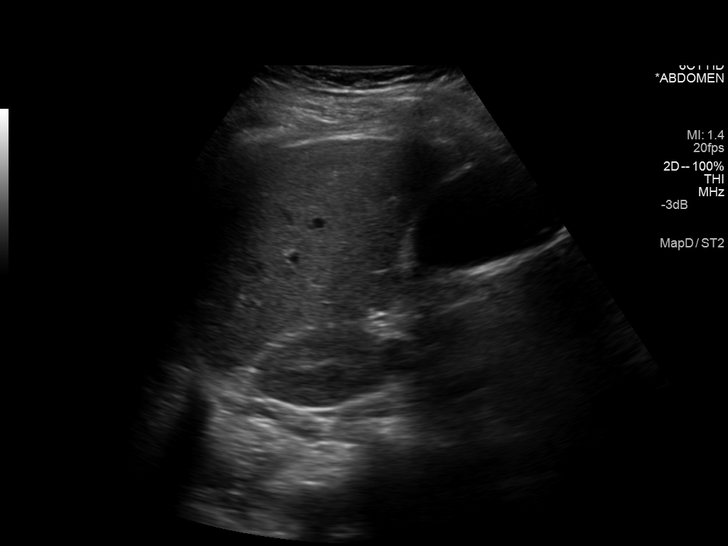
[im 7/42]
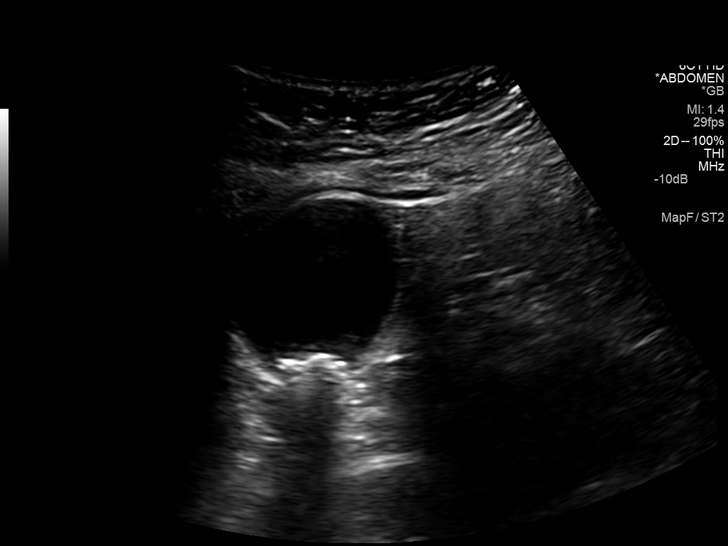
[im 11/42]
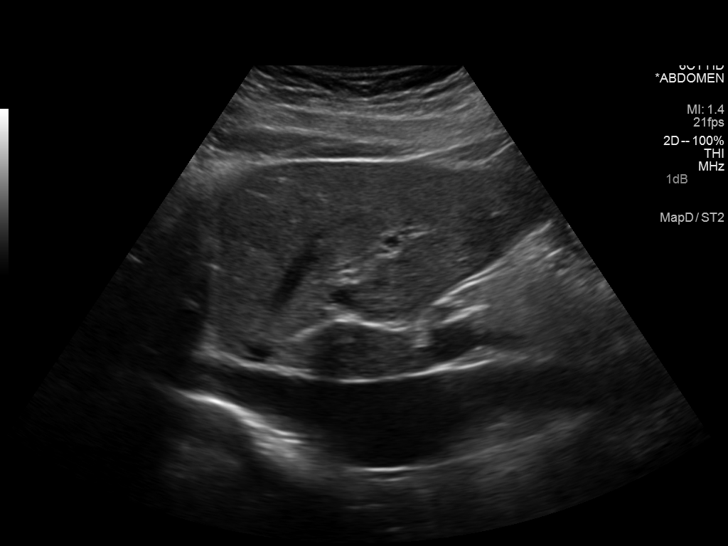
[im 14/42]
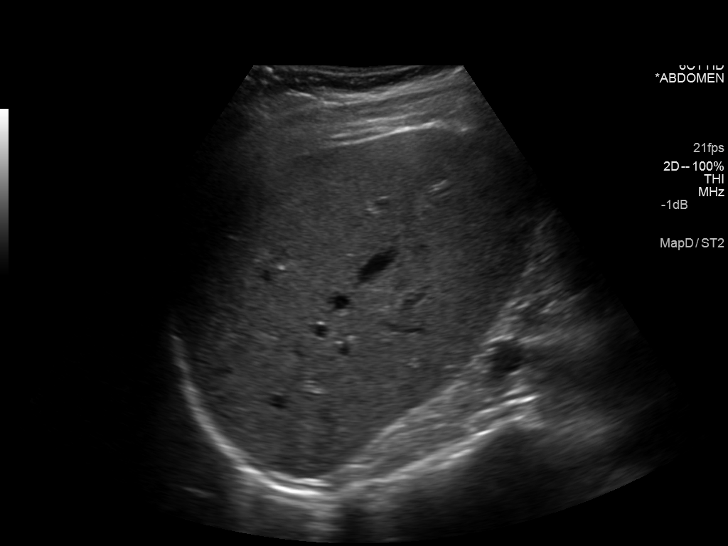
[im 16/42]
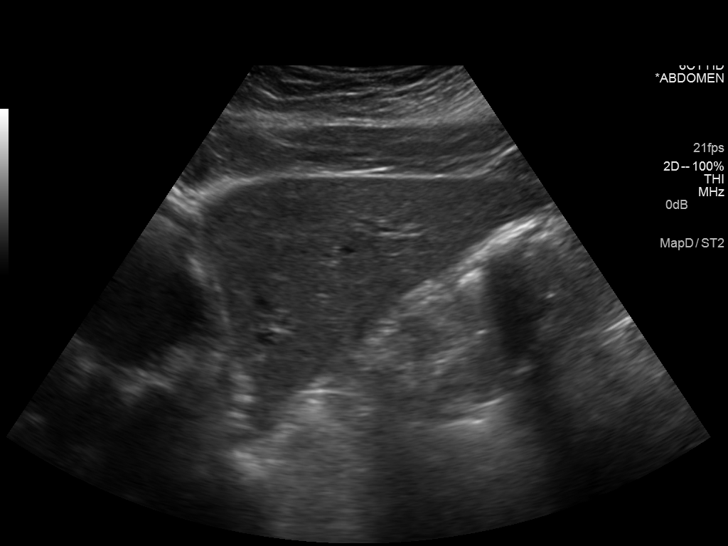
[im 19/42]
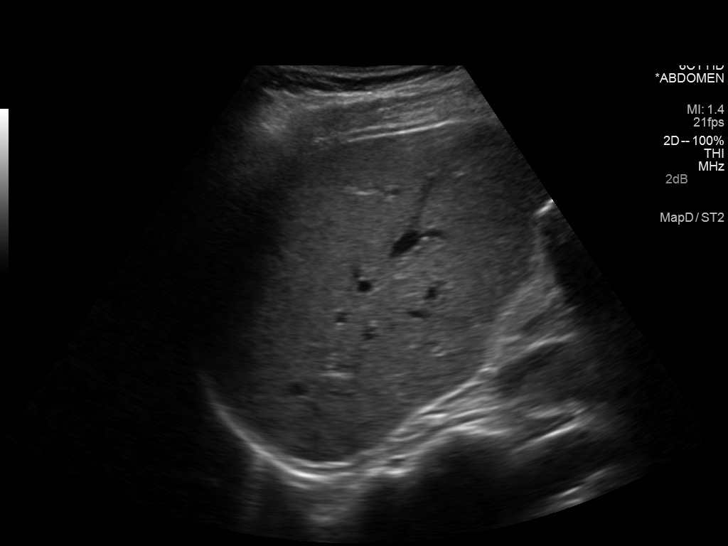
[im 23/42]
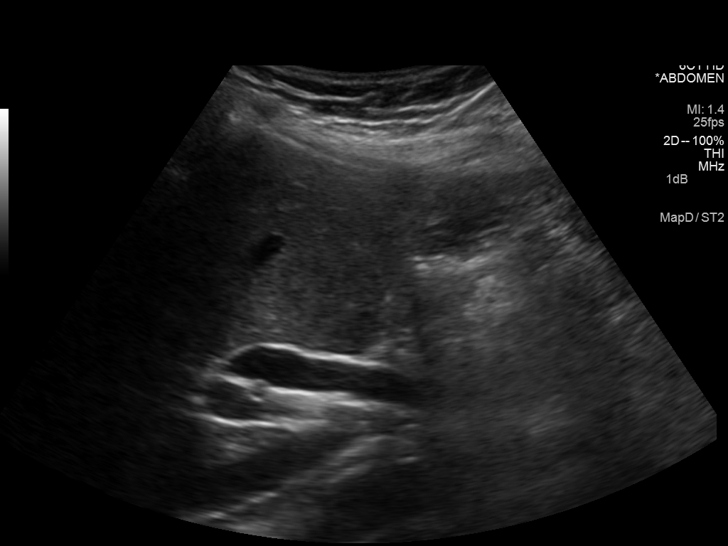
[im 26/42]
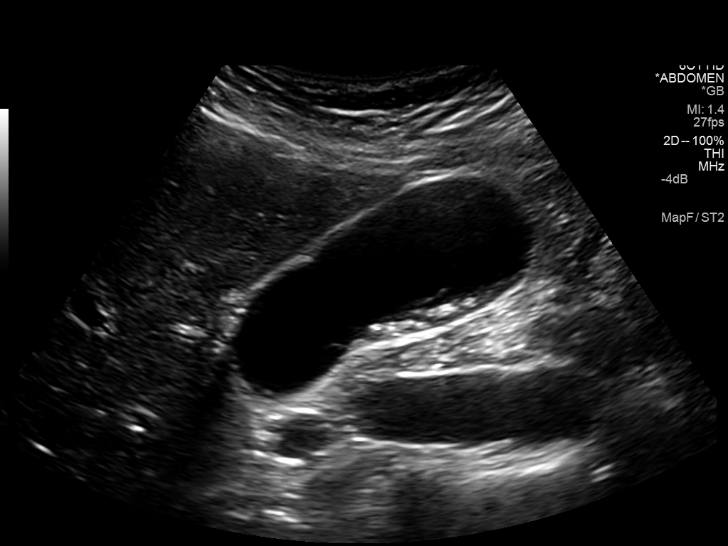
[im 28/42]
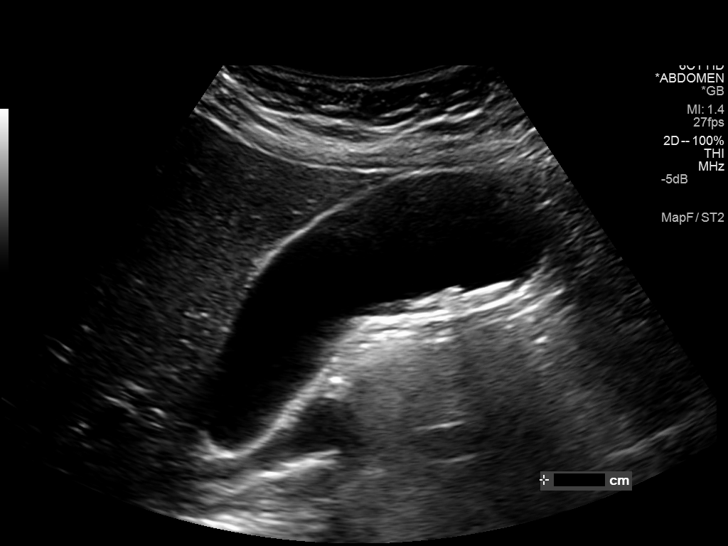
[im 31/42]
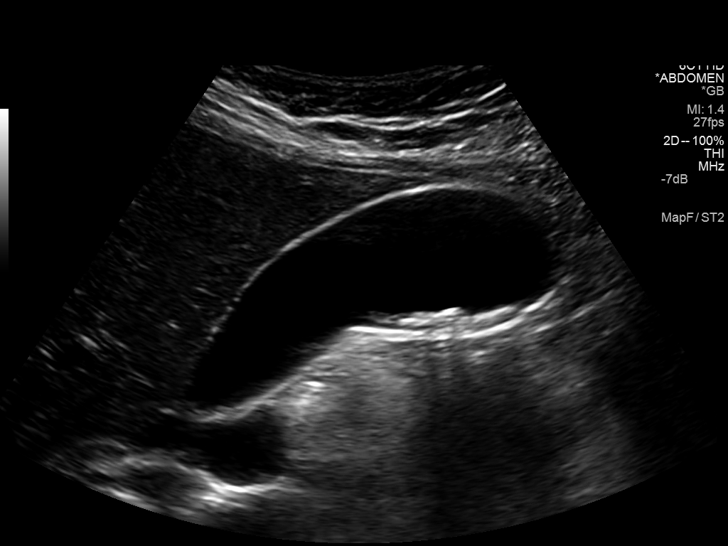
[im 35/42]
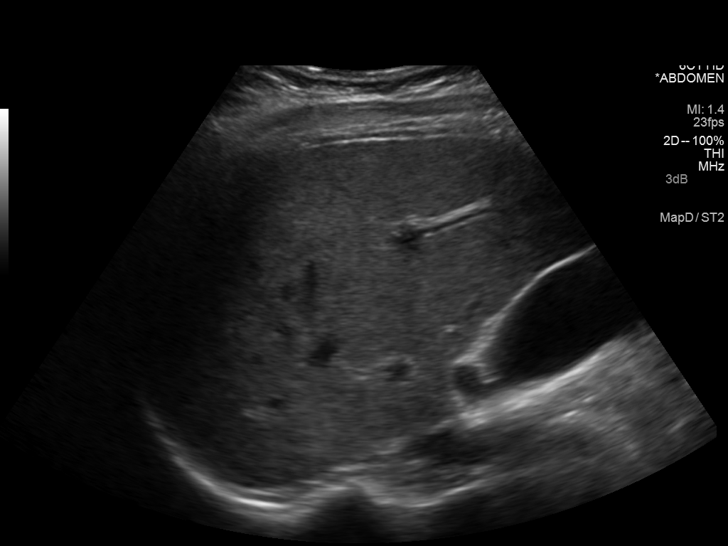
[im 38/42]
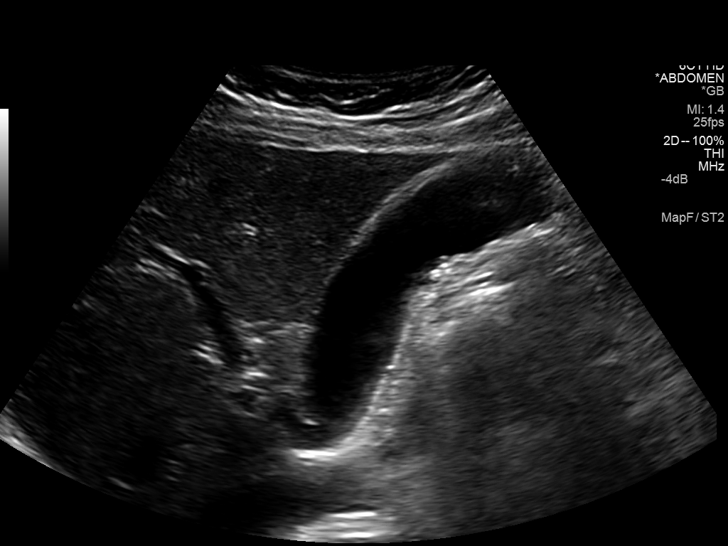
[im 42/42]
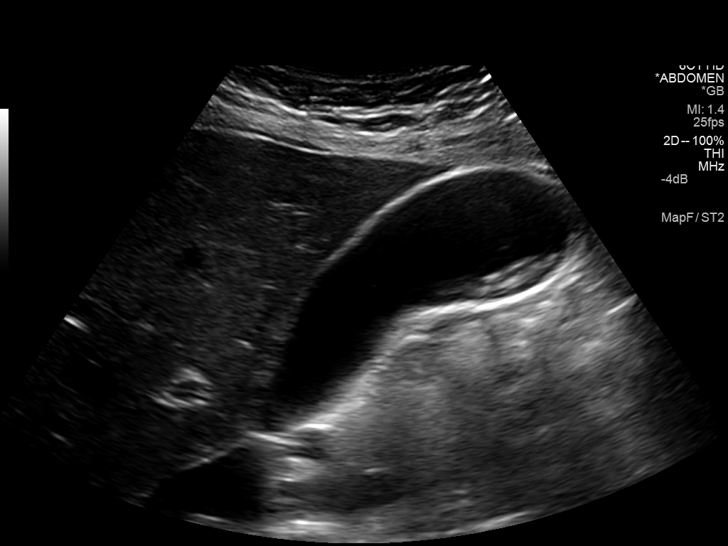

[14 of 25 positions shown; findings below may reference images not displayed]

FINDINGS: Gallbladder:

Scattered small mobile stones are seen in the gallbladder. The
gallbladder is borderline prominent but otherwise unremarkable in
appearance. No gallbladder wall thickening or pericholecystic fluid
is seen. No ultrasonographic Murphy's sign is elicited, though pain
medication has been administered.

Common bile duct:

Diameter: 0.7 cm, slightly distended.

Liver:

No focal lesion identified. Within normal limits in parenchymal
echogenicity.
IMPRESSION: Cholelithiasis noted; gallbladder borderline prominent but otherwise
unremarkable in appearance. Slight distention of the common hepatic
duct, of uncertain significance. Distal obstruction cannot be
entirely excluded, though the amount of ductal distention is
relatively minimal.

## 2015-06-30 IMAGING — CR DG CHEST 2V
1 series · 2 of 2 positions shown · non-contrast
Comparison: Chest radiograph performed 07/06/2013

CLINICAL DATA: Sharp chest pain, radiating to the back. Shortness
of breath and diaphoresis.

EXAM:
CHEST  2 VIEW

[Series 1: w chest pa · 0.14mm/px · 2 of 2 slices shown]
[im 1/2]
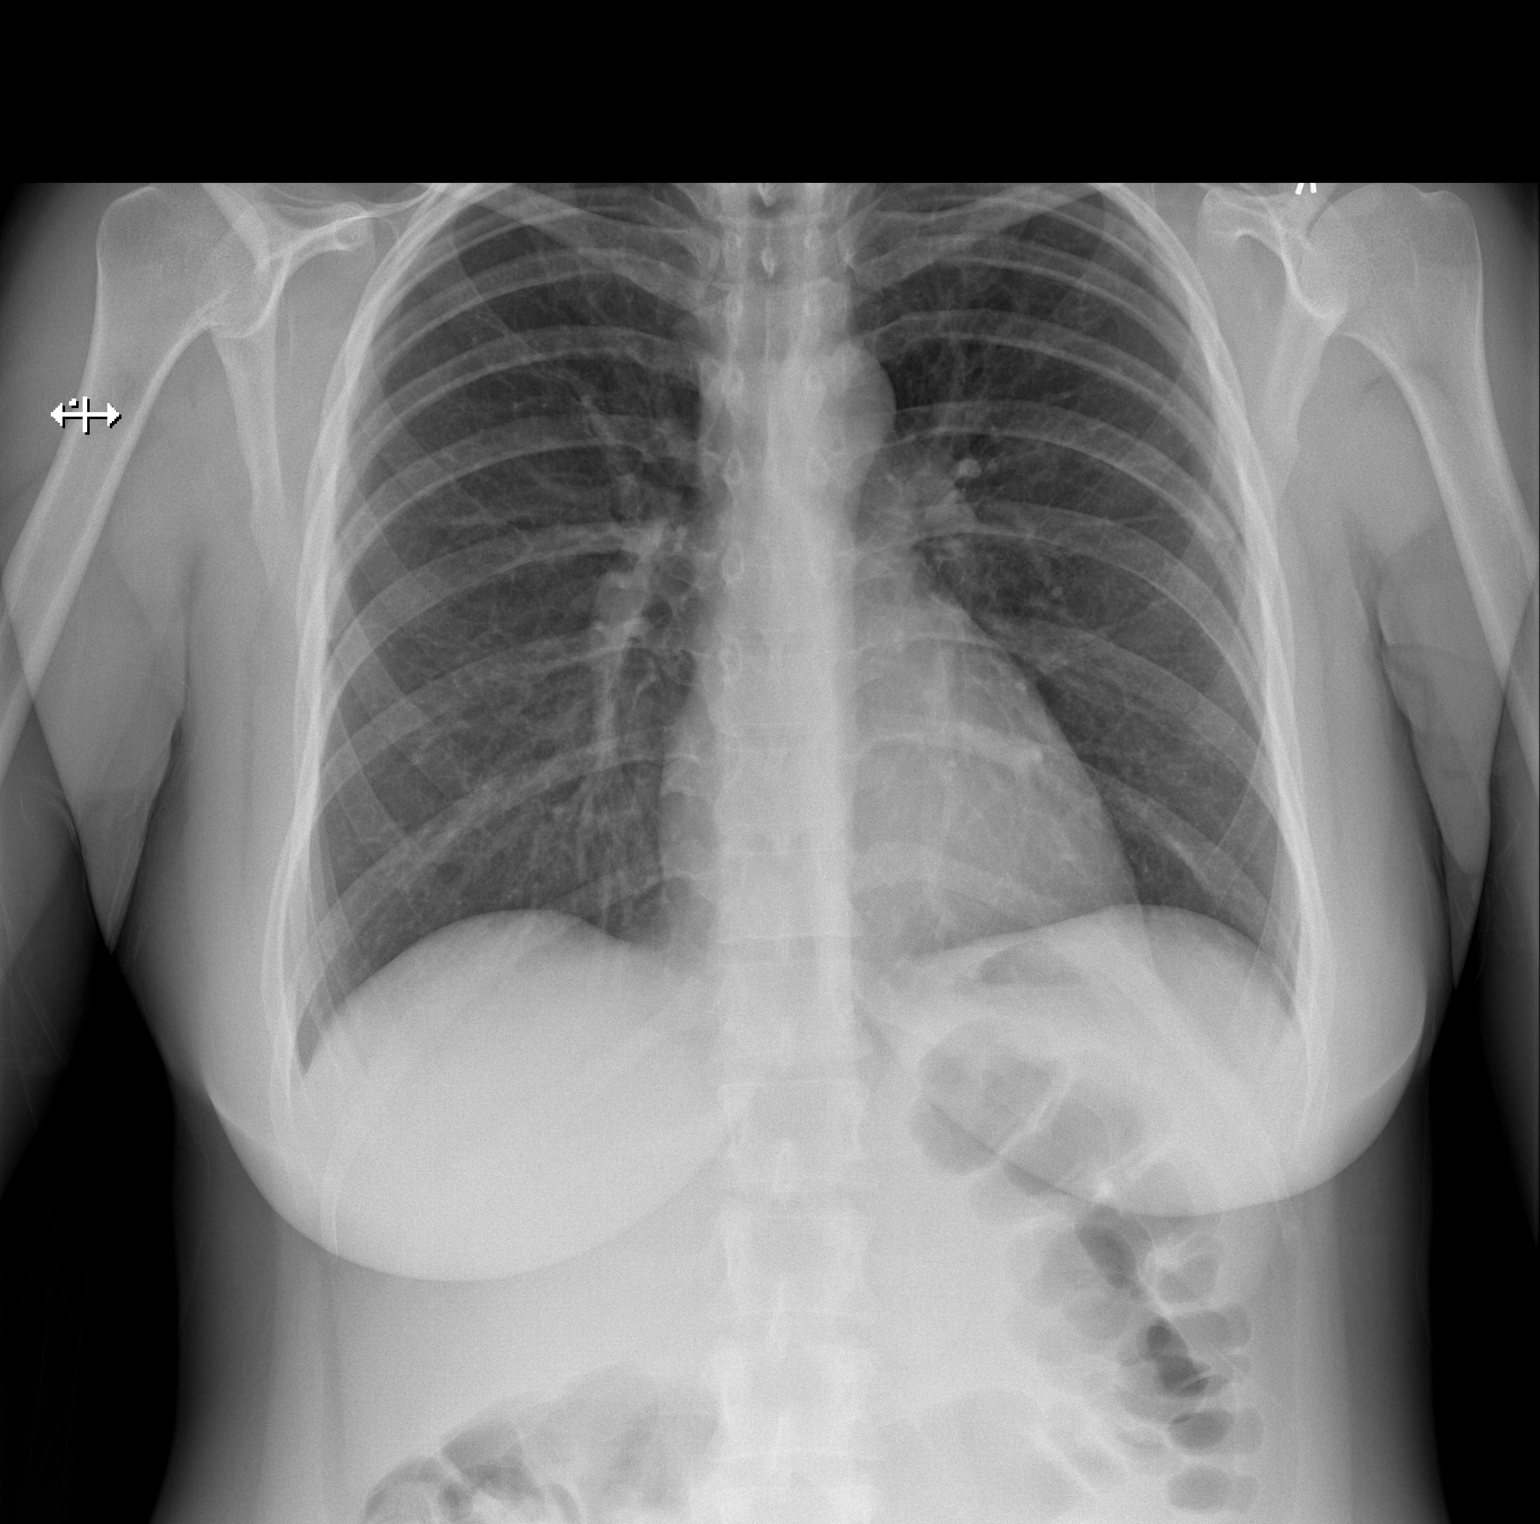
[im 2/2]
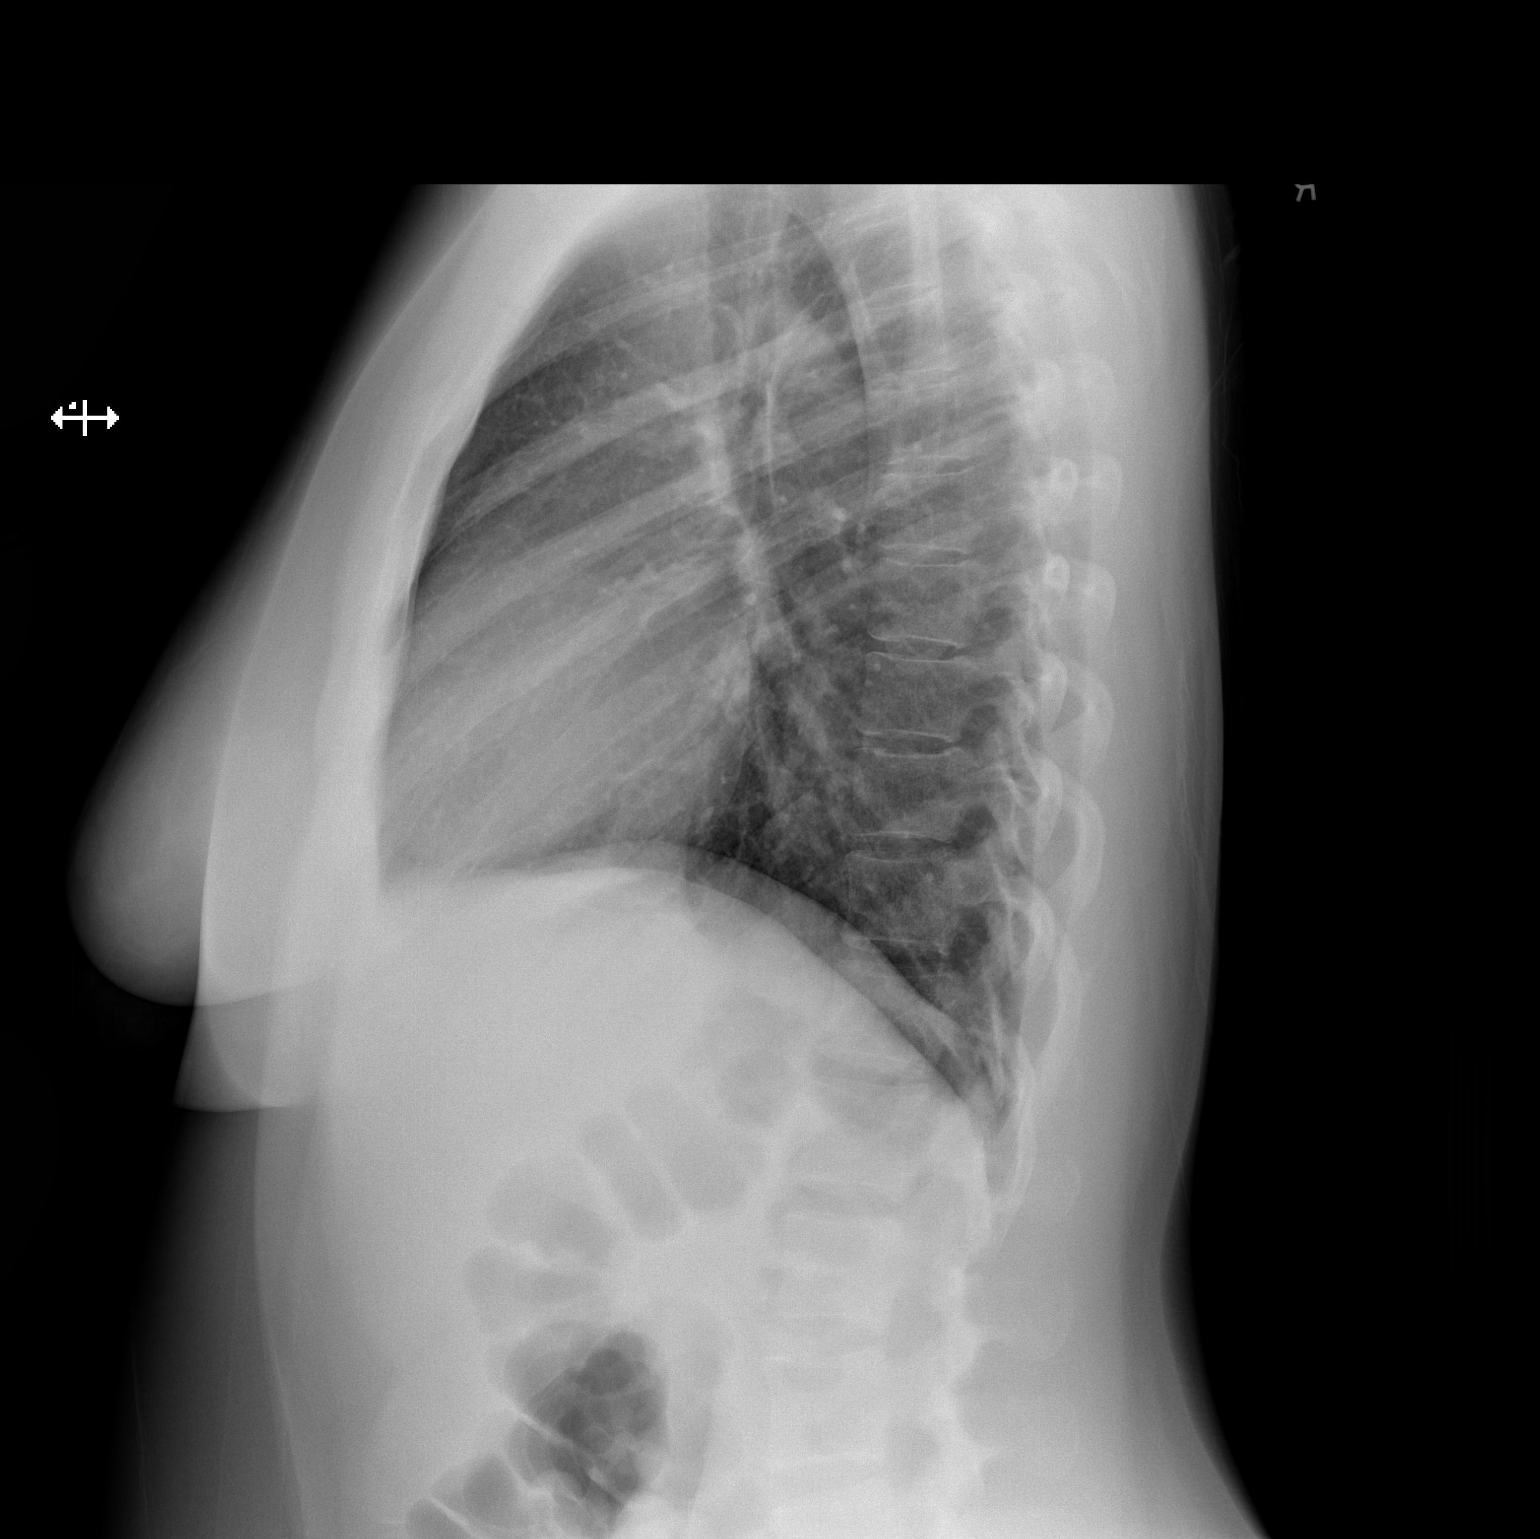

[2 of 2 positions shown; findings below may reference images not displayed]

FINDINGS: The lungs are well-aerated and clear. There is no evidence of focal
opacification, pleural effusion or pneumothorax.

The heart is normal in size; the mediastinal contour is within
normal limits. No acute osseous abnormalities are seen.
IMPRESSION: No acute cardiopulmonary process seen.

## 2015-08-01 ENCOUNTER — Encounter (HOSPITAL_COMMUNITY): Payer: Self-pay | Admitting: *Deleted

## 2016-12-01 IMAGING — CR DG CHEST 2V
1 series · 2 of 2 positions shown · non-contrast
Comparison: October 06, 2013

CLINICAL DATA: Chest pain and shortness of Breath

EXAM:
CHEST  2 VIEW

[Series 2: w chest pa · 0.14mm/px · 2 of 2 slices shown]
[im 1/2]
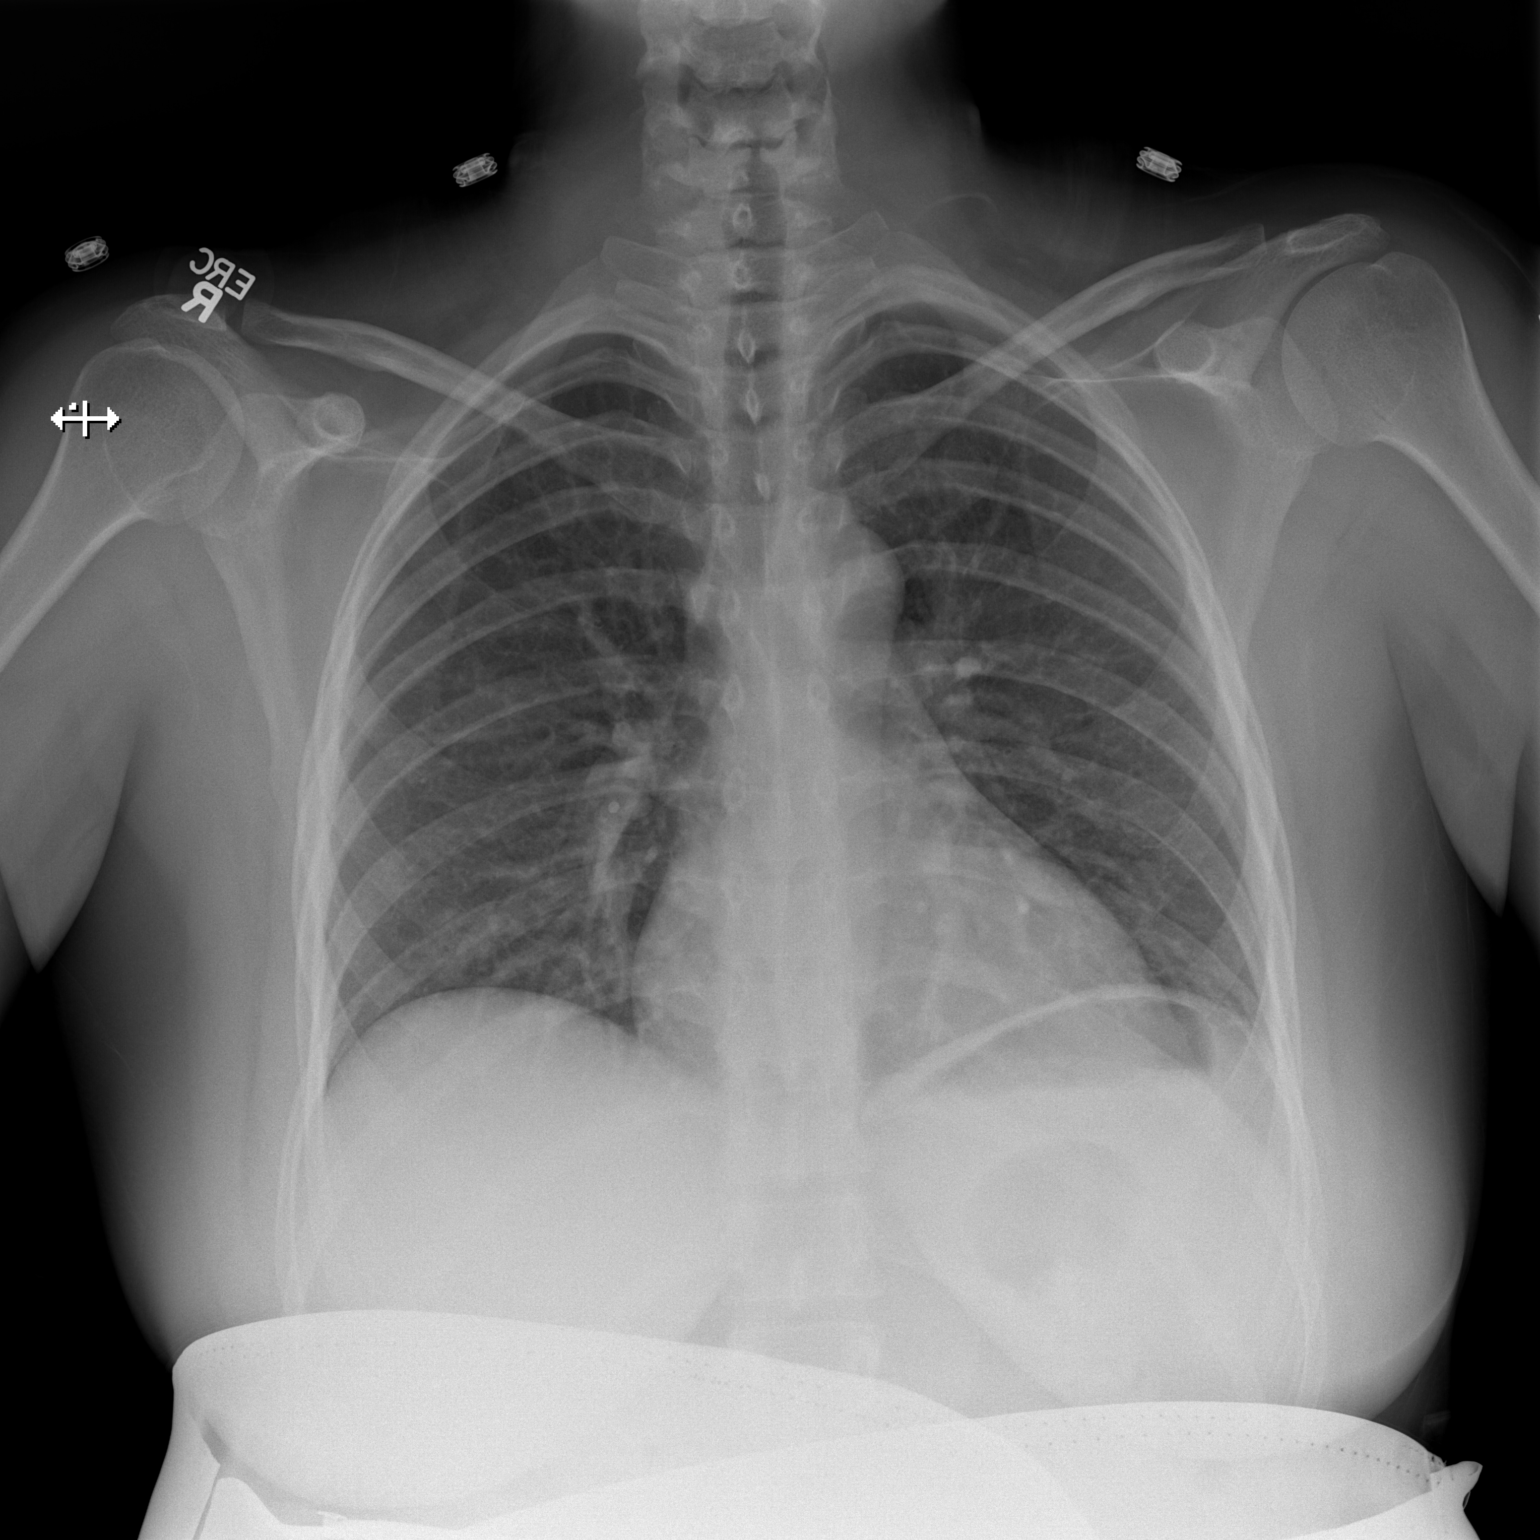
[im 2/2]
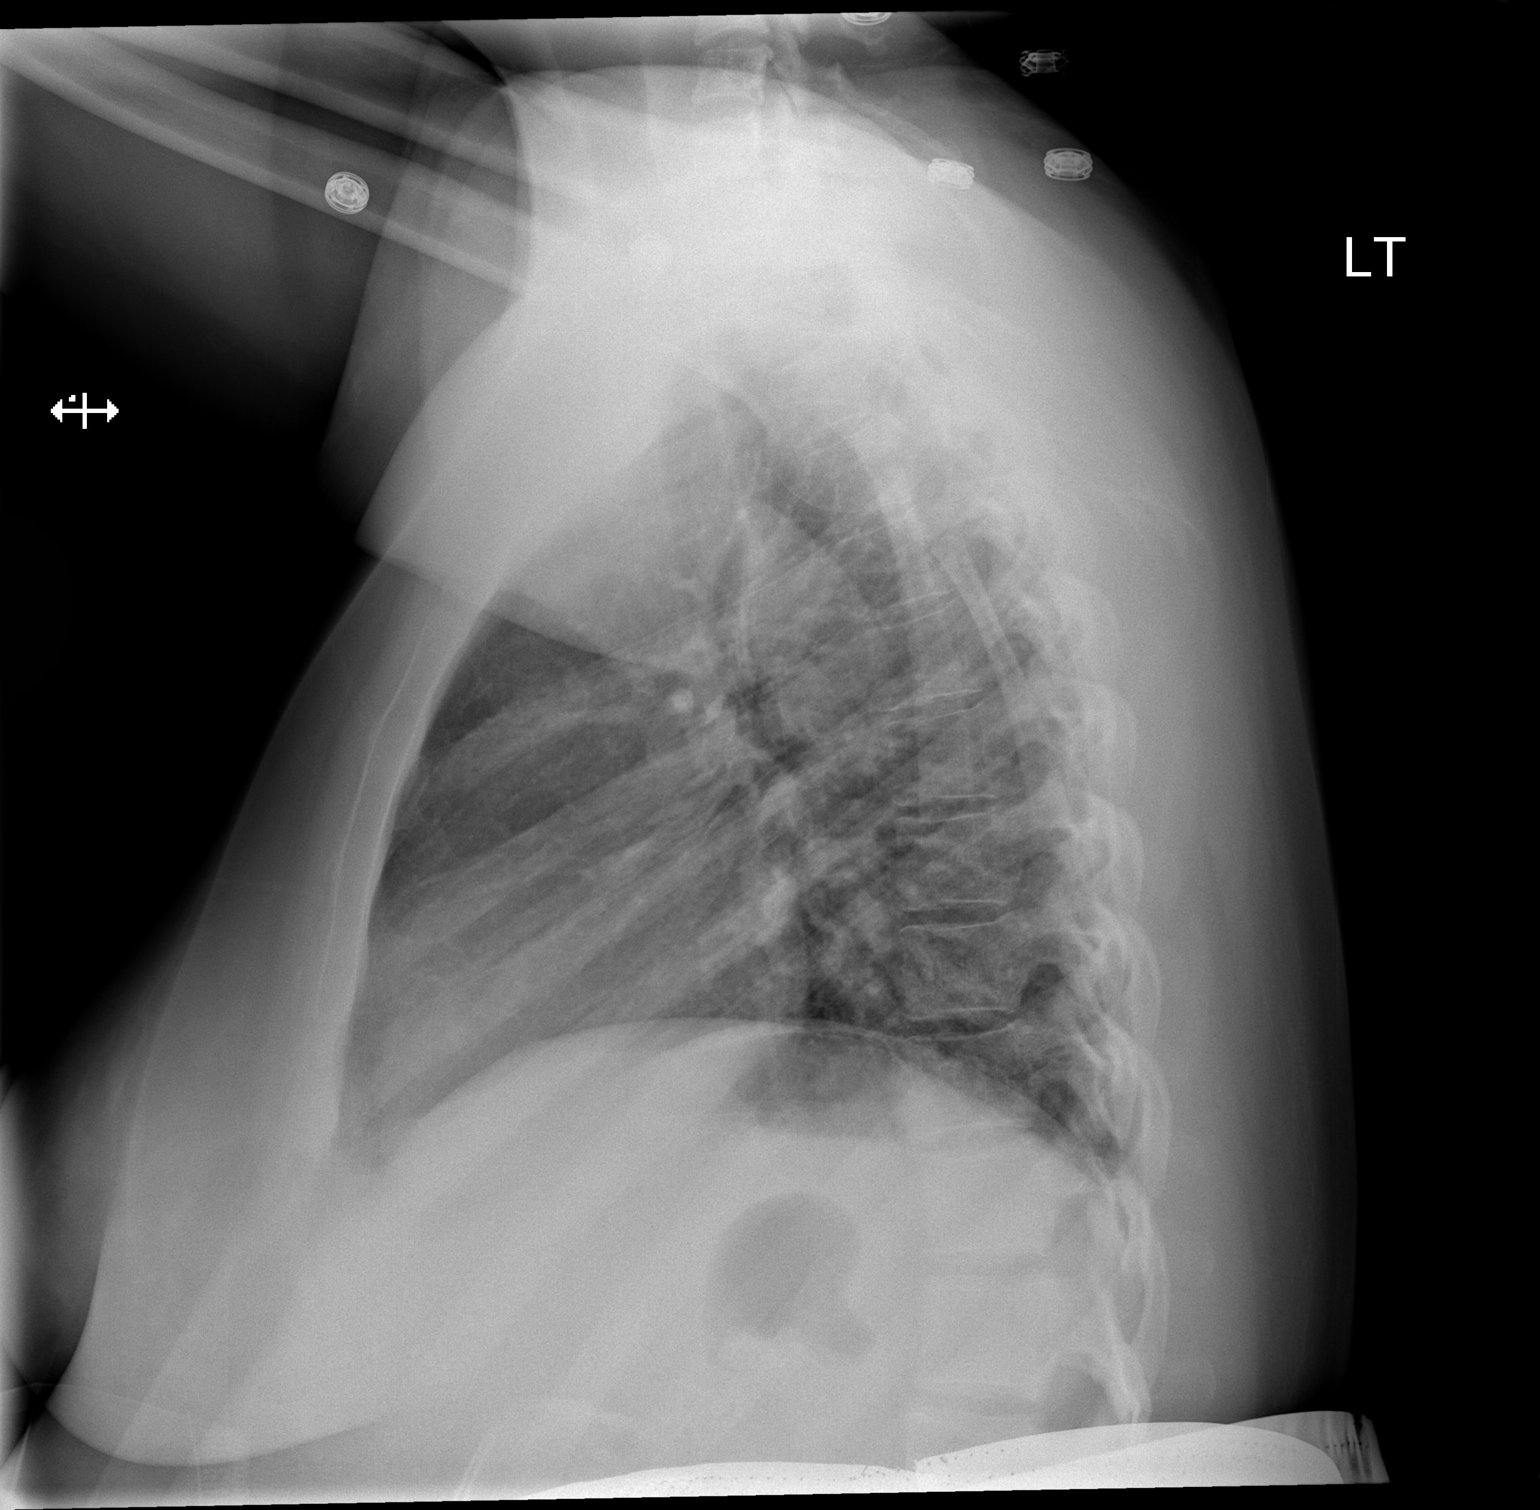

[2 of 2 positions shown; findings below may reference images not displayed]

FINDINGS: Lungs are clear. Heart size and pulmonary vascularity are normal. No
adenopathy. No pneumothorax. No bone lesions.
IMPRESSION: No abnormality noted.

## 2019-01-27 ENCOUNTER — Other Ambulatory Visit: Payer: Self-pay | Admitting: *Deleted

## 2019-01-27 DIAGNOSIS — Z20822 Contact with and (suspected) exposure to covid-19: Secondary | ICD-10-CM

## 2019-01-28 LAB — NOVEL CORONAVIRUS, NAA: SARS-CoV-2, NAA: NOT DETECTED

## 2019-06-15 ENCOUNTER — Ambulatory Visit
Admission: EM | Admit: 2019-06-15 | Discharge: 2019-06-15 | Disposition: A | Discharge status: Home / Self Care | Payer: BC Managed Care – PPO | Attending: Internal Medicine | Admitting: Internal Medicine

## 2019-06-15 ENCOUNTER — Other Ambulatory Visit: Payer: Self-pay

## 2019-06-15 DIAGNOSIS — U071 COVID-19: Secondary | ICD-10-CM | POA: Diagnosis present

## 2019-06-15 LAB — SARS CORONAVIRUS 2 AG (30 MIN TAT): SARS Coronavirus 2 Ag: POSITIVE — AB

## 2019-06-15 MED ORDER — ACETAMINOPHEN 500 MG PO TABS: 500.0000 mg | ORAL_TABLET | Freq: Four times a day (QID) | ORAL | 0 refills | Status: AC | PRN

## 2019-06-15 MED ORDER — BENZONATATE 100 MG PO CAPS: 100.0000 mg | ORAL_CAPSULE | Freq: Three times a day (TID) | ORAL | 0 refills | Status: AC | PRN

## 2019-06-15 NOTE — ED Provider Notes (Signed)
[facility]    CSN: [id number] Arrival date & time: 06/15/19  1306      History   Chief Complaint Chief Complaint  Patient presents with  . Cough  . Nasal Congestion    HPI Brenda Leonard is a 39 y.o. female no past medical history comes to urgent care with 2-day history of nonproductive cough, nasal congestion, generalized body aches and headaches.  Symptoms started a couple of days ago and has been persistent.  Patient was recently exposed to Covid 19+ individuals at the workplace.  No fever or chills.  No loss of taste or smell.  No nausea or vomiting.  No diarrhea.   HPI  Past Medical History:  Diagnosis Date  . Vaginal Pap smear, abnormal 2013    Patient Active Problem List   Diagnosis Date Noted  . Common bile duct dilatation 03/14/2015  . Colicky RUQ abdominal pain 03/14/2015  . Dyspnea 03/11/2015  . Dyspnea and respiratory abnormality 03/10/2015  . Supervision of normal pregnancy in second trimester 02/14/2015  . Need for rhogam due to Rh negative mother 02/14/2015  . Abnormal Pap smear of cervix 10/26/2014  . Anxiety disorder 10/26/2014  . History of UTI 10/26/2014  . Anxiety and depression 02/13/2014  . Adiposity 02/13/2014  . Biliary calculi, common bile duct 11/22/2013    Past Surgical History:  Procedure Laterality Date  . GALLBLADDER SURGERY  11/2013    OB History    Gravida  3   Para  2   Term  2   Preterm      AB      Living  2     SAB      TAB      Ectopic      Multiple      Live Births  2            Home Medications    Prior to Admission medications   Medication Sig Start Date End Date Taking? Authorizing Provider  citalopram (CELEXA) 10 MG tablet Take 10 mg by mouth daily. 04/17/19  Yes [provider]  acetaminophen (TYLENOL) 500 MG tablet Take 1 tablet (500 mg total) by mouth every 6 (six) hours as needed. 06/15/19   Chareese Sergent, Britta Mccreedy, MD  benzonatate (TESSALON) 100 MG capsule Take 1 capsule  (100 mg total) by mouth 3 (three) times daily as needed for cough. 06/15/19   LampteyBritta Mccreedy, MD    Family History Family History  Problem Relation Age of Onset  . Heart disease Sister        Heart Deformation    Social History Social History   Tobacco Use  . Smoking status: Never Smoker  . Smokeless tobacco: Never Used  Substance Use Topics  . Alcohol use: No  . Drug use: No     Allergies   Iodinated diagnostic agents   Review of Systems Review of Systems  Constitutional: Positive for activity change. Negative for chills, fatigue and fever.  HENT: Positive for congestion, rhinorrhea and sore throat. Negative for ear discharge.   Eyes: Negative.   Respiratory: Negative for apnea, cough and wheezing.   Cardiovascular: Negative for chest pain and palpitations.  Gastrointestinal: Negative for diarrhea, nausea and vomiting.  Genitourinary: Negative.   Musculoskeletal: Positive for arthralgias and myalgias. Negative for joint swelling.  Skin: Negative.   Neurological: Positive for headaches. Negative for dizziness, light-headedness and numbness.  Psychiatric/Behavioral: Negative for agitation, confusion, decreased concentration and dysphoric mood.  Physical Exam Triage Vital Signs ED Triage Vitals  Enc Vitals Group     BP 06/15/19 1327 119/84     Pulse Rate 06/15/19 1327 84     Resp --      Temp 06/15/19 1327 98.1 F (36.7 C)     Temp Source 06/15/19 1327 Oral     SpO2 06/15/19 1327 99 %     Weight 06/15/19 1324 200 lb (90.7 kg)     Height 06/15/19 1324 5\' 2"  (1.575 m)     Head Circumference --      Peak Flow --      Pain Score 06/15/19 1323 2     Pain Loc --      Pain Edu? --      Excl. in [place]? --    No data found.  Updated Vital Signs BP 119/84 (BP Location: Left Arm)   Pulse 84   Temp 98.1 F (36.7 C) (Oral)   Ht 5\' 2"  (1.575 m)   Wt 90.7 kg   LMP 06/09/2019   SpO2 99%   BMI 36.58 kg/m   Visual Acuity Right Eye Distance:   Left Eye  Distance:   Bilateral Distance:    Right Eye Near:   Left Eye Near:    Bilateral Near:     Physical Exam   UC Treatments / Results  Labs (all labs ordered are listed, but only abnormal results are displayed) Labs Reviewed  SARS CORONAVIRUS 2 AG (30 MIN TAT) - Abnormal; Notable for the following components:      Result Value   SARS Coronavirus 2 Ag POSITIVE (*)    All other components within normal limits    EKG   Radiology No results found.  Procedures Procedures (including critical care time)  Medications Ordered in UC Medications - No data to display  Initial Impression / Assessment and Plan / UC Course  I have reviewed the triage vital signs and the nursing notes.  Pertinent labs & imaging results that were available during my care of the patient were reviewed by me and considered in my medical decision making (see chart for details).     1.  COVID-19 infection: Point-of-care COVID-19 is positive Patient is advised to self isolate Patient is advised to stay well-hydrated Tessalon Perles as needed for cough Tylenol as needed for body aches Patient will quarantine with family for 10 days.  CDC guidelines will be followed to and quarantine. Final Clinical Impressions(s) / UC Diagnoses   Final diagnoses:  COVID-19 virus infection   Discharge Instructions   None    ED Prescriptions    Medication Sig Dispense Auth. Provider   benzonatate (TESSALON) 100 MG capsule Take 1 capsule (100 mg total) by mouth 3 (three) times daily as needed for cough. 30 capsule Orlando Devereux, Myrene Galas, MD   acetaminophen (TYLENOL) 500 MG tablet Take 1 tablet (500 mg total) by mouth every 6 (six) hours as needed. 30 tablet Nyana Haren, Myrene Galas, MD     PDMP not reviewed this encounter.   Chase Picket, MD 06/15/19 [phone]

## 2019-06-15 NOTE — ED Triage Notes (Signed)
Pt presents with c/o COVID exposure at work. She currently has 7 coworkers that have tested positive at this time. She states she has dry cough, nasal congestion, muscle aches, headache that started yesterday. She denies fever/chills, n/v/d.

## 2020-11-05 ENCOUNTER — Other Ambulatory Visit: Payer: Self-pay

## 2020-11-05 ENCOUNTER — Emergency Department
Admission: EM | Admit: 2020-11-05 | Discharge: 2020-11-05 | Disposition: A | Discharge status: Home / Self Care | Payer: BC Managed Care – PPO | Attending: Emergency Medicine | Admitting: Emergency Medicine

## 2020-11-05 ENCOUNTER — Encounter: Payer: Self-pay | Admitting: *Deleted

## 2020-11-05 ENCOUNTER — Emergency Department: Payer: BC Managed Care – PPO

## 2020-11-05 DIAGNOSIS — S93401A Sprain of unspecified ligament of right ankle, initial encounter: Secondary | ICD-10-CM | POA: Insufficient documentation

## 2020-11-05 DIAGNOSIS — S99911A Unspecified injury of right ankle, initial encounter: Secondary | ICD-10-CM | POA: Diagnosis present

## 2020-11-05 DIAGNOSIS — Y92410 Unspecified street and highway as the place of occurrence of the external cause: Secondary | ICD-10-CM | POA: Insufficient documentation

## 2020-11-05 MED ORDER — MELOXICAM 7.5 MG PO TABS
15.0000 mg | ORAL_TABLET | Freq: Once | ORAL | Status: AC
  Administered 2020-11-05: 15 mg via ORAL
  Filled 2020-11-05: qty 2

## 2020-11-05 MED ORDER — OXYCODONE-ACETAMINOPHEN 5-325 MG PO TABS
1.0000 | ORAL_TABLET | Freq: Once | ORAL | Status: AC
  Administered 2020-11-05: 1 via ORAL
  Filled 2020-11-05: qty 1

## 2020-11-05 MED ORDER — HYDROCODONE-ACETAMINOPHEN 5-325 MG PO TABS: 1.0000 | ORAL_TABLET | ORAL | 0 refills | Status: AC | PRN

## 2020-11-05 MED ORDER — MELOXICAM 15 MG PO TABS: 15.0000 mg | ORAL_TABLET | Freq: Every day | ORAL | 0 refills | Status: AC
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
## 2020-11-05 NOTE — ED Notes (Signed)
Pt calm , collective, ambulated with crutches, significant other at bedside
